# Patient Record
Sex: Female | Born: 1945 | Race: White | Hispanic: No | Marital: Married | Smoking: Never smoker
Health system: Southern US, Community
[De-identification: ages and names within clinical notes are randomized; demographics above are authoritative.]

## PROBLEM LIST (undated history)

## (undated) DIAGNOSIS — E039 Hypothyroidism, unspecified: Secondary | ICD-10-CM

## (undated) DIAGNOSIS — F1010 Alcohol abuse, uncomplicated: Secondary | ICD-10-CM

## (undated) DIAGNOSIS — M8580 Other specified disorders of bone density and structure, unspecified site: Secondary | ICD-10-CM

## (undated) DIAGNOSIS — C4491 Basal cell carcinoma of skin, unspecified: Secondary | ICD-10-CM

## (undated) DIAGNOSIS — E7800 Pure hypercholesterolemia, unspecified: Secondary | ICD-10-CM

## (undated) DIAGNOSIS — F32A Depression, unspecified: Secondary | ICD-10-CM

## (undated) HISTORY — PX: ADENOIDECTOMY: SUR15

## (undated) HISTORY — PX: COLONOSCOPY: SHX174

## (undated) HISTORY — PX: APPENDECTOMY: SHX54

## (undated) HISTORY — PX: TONSILLECTOMY: SUR1361

---

## 1998-04-18 ENCOUNTER — Other Ambulatory Visit: Admission: RE | Admit: 1998-04-18 | Discharge: 1998-04-18 | Payer: Self-pay | Admitting: *Deleted

## 1998-07-11 ENCOUNTER — Other Ambulatory Visit: Admission: RE | Admit: 1998-07-11 | Discharge: 1998-07-11 | Payer: Self-pay | Admitting: *Deleted

## 2001-05-14 ENCOUNTER — Other Ambulatory Visit: Admission: RE | Admit: 2001-05-14 | Discharge: 2001-05-14 | Payer: Self-pay | Admitting: *Deleted

## 2002-08-20 ENCOUNTER — Ambulatory Visit (HOSPITAL_COMMUNITY): Admission: RE | Admit: 2002-08-20 | Discharge: 2002-08-20 | Payer: Self-pay | Admitting: Gastroenterology

## 2002-08-20 ENCOUNTER — Encounter (INDEPENDENT_AMBULATORY_CARE_PROVIDER_SITE_OTHER): Payer: Self-pay | Admitting: Specialist

## 2002-09-01 ENCOUNTER — Encounter: Payer: Self-pay | Admitting: Gastroenterology

## 2002-09-01 ENCOUNTER — Encounter: Admission: RE | Admit: 2002-09-01 | Discharge: 2002-09-01 | Payer: Self-pay | Admitting: Gastroenterology

## 2003-06-02 ENCOUNTER — Other Ambulatory Visit: Admission: RE | Admit: 2003-06-02 | Discharge: 2003-06-02 | Payer: Self-pay | Admitting: Family Medicine

## 2003-08-05 ENCOUNTER — Encounter: Admission: RE | Admit: 2003-08-05 | Discharge: 2003-08-05 | Payer: Self-pay | Admitting: Family Medicine

## 2004-08-04 IMAGING — US US PELVIS COMPLETE MODIFY
1 series · 14 of 25 positions shown · non-contrast
Comparison: none

CLINICAL DATA: Abdominal pain and bloating.
 ULTRASOUND OF THE PELVIS
 Both transabdominal and transvaginal ultrasound of the pelvis were performed.  The uterus is retroflexed.  The uterus measures 6.2 cm sagittally with a depth of 3.0 cm and a width of 3.7 cm.  The endometrium measures 1.1 mm in thickness and there are a few echogenic areas along the endometrial lining.  The ovaries are not well seen.  The best measurements possible show the right ovary to measuring 1.9 x 1.0 x 0.9 cm and the left ovary 1.6 x 1.1 x 0.9 cm.  Only a small amount of free fluid is noted in the pelvis.
 IMPRESSION
 Negative pelvic ultrasound.  Neither ovary is well seen.

[Series 1: unknown · 0.22mm/px · 14 of 48 slices shown]
[im 1/48]
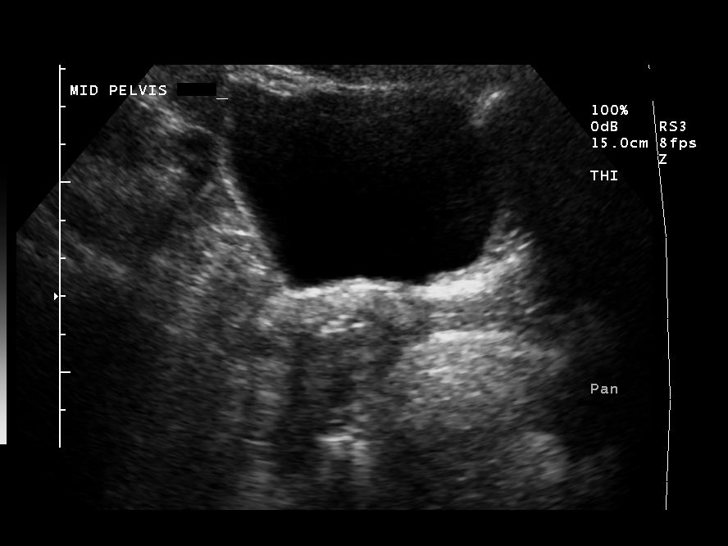
[im 4/48]
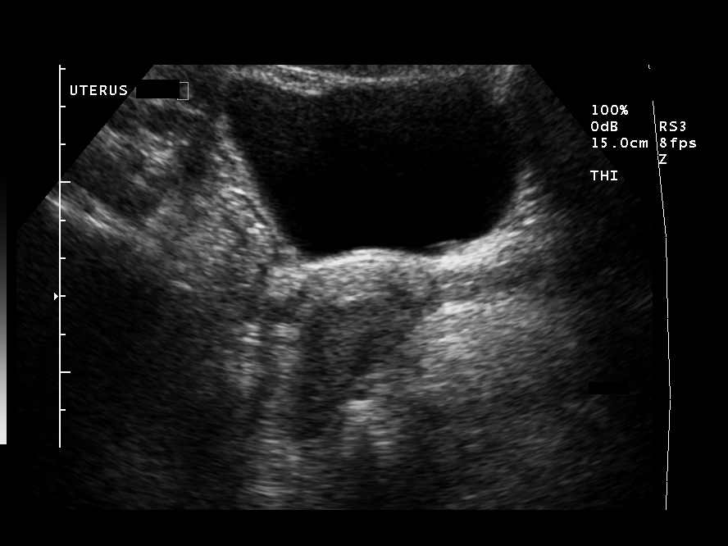
[im 8/48]
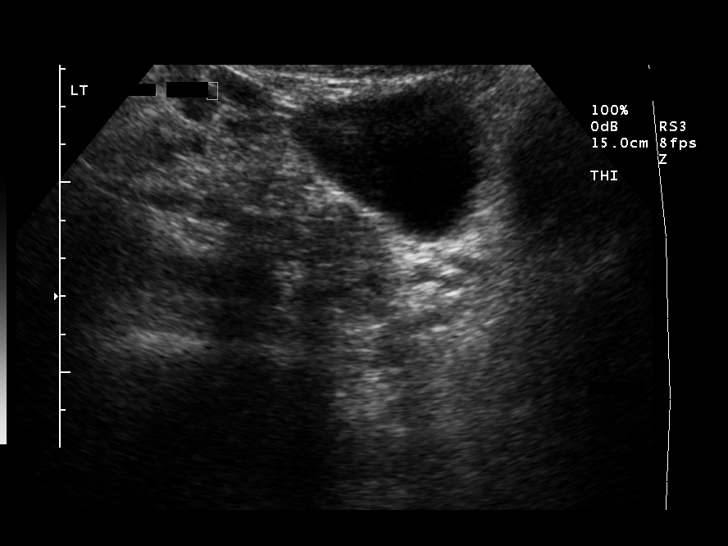
[im 12/48]
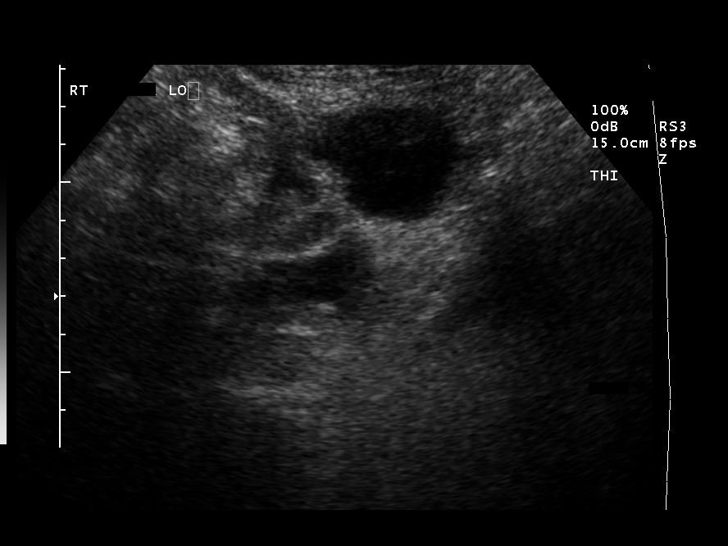
[im 16/48]
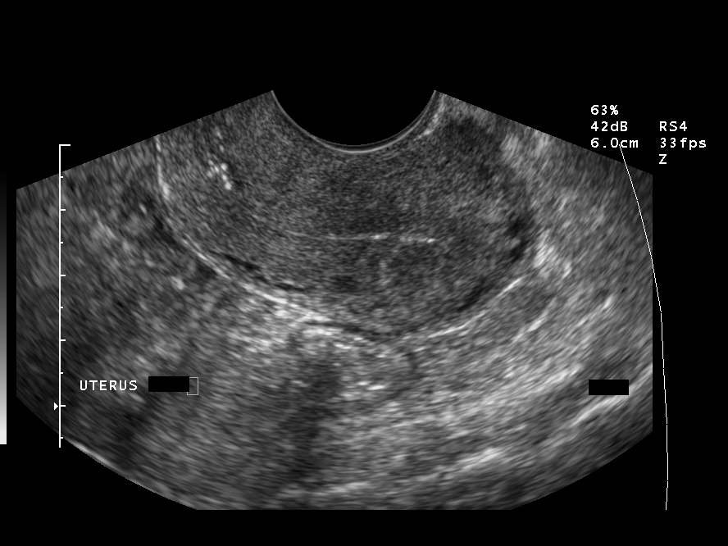
[im 18/48]
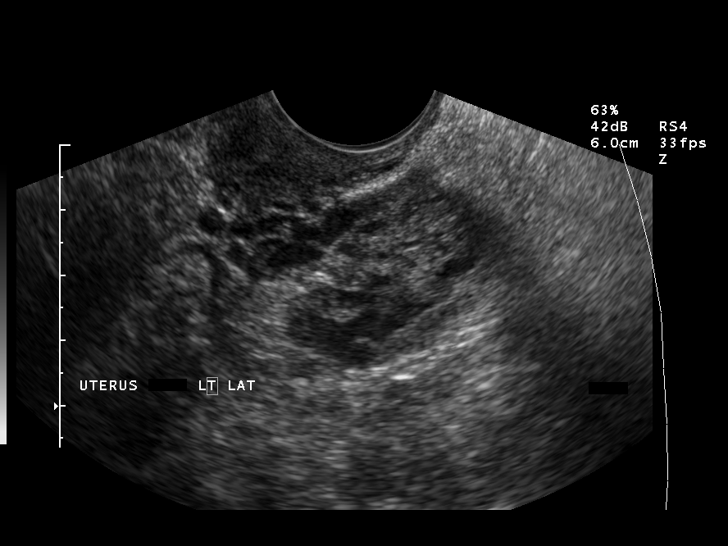
[im 22/48]
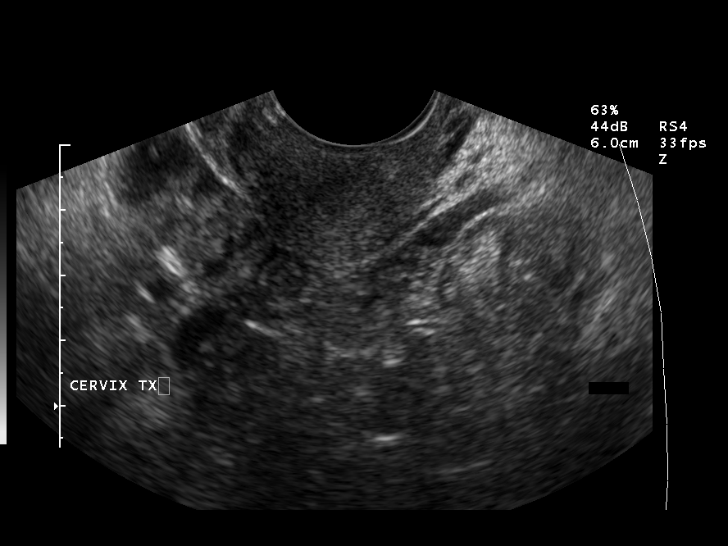
[im 26/48]
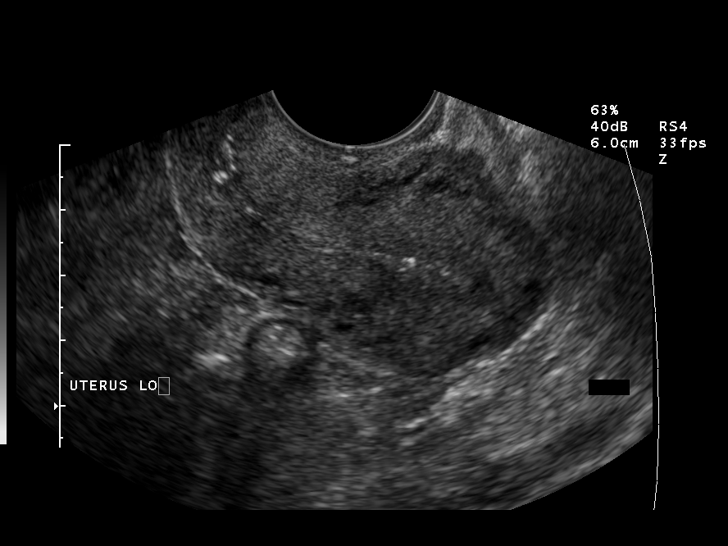
[im 30/48]
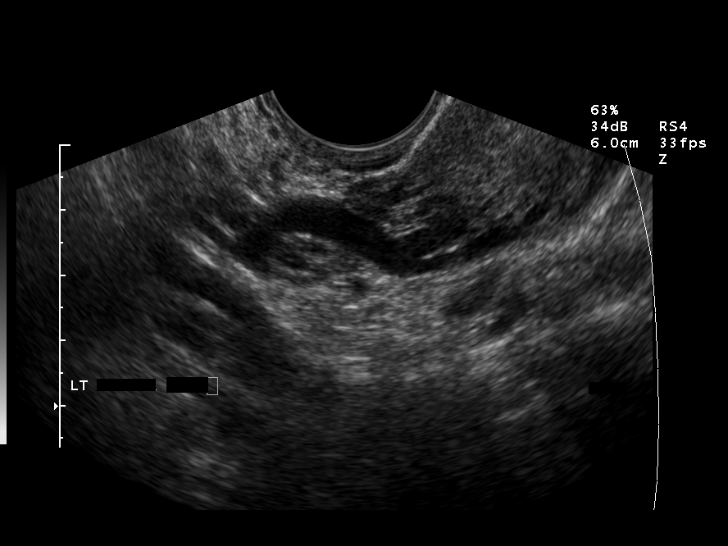
[im 32/48]
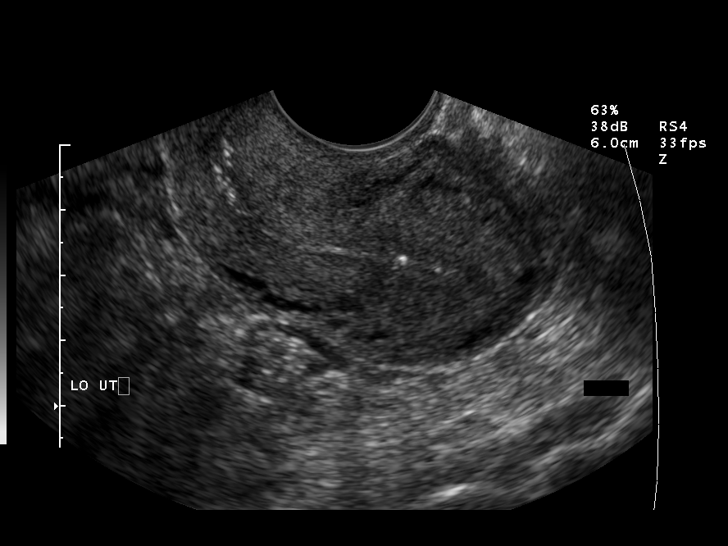
[im 36/48]
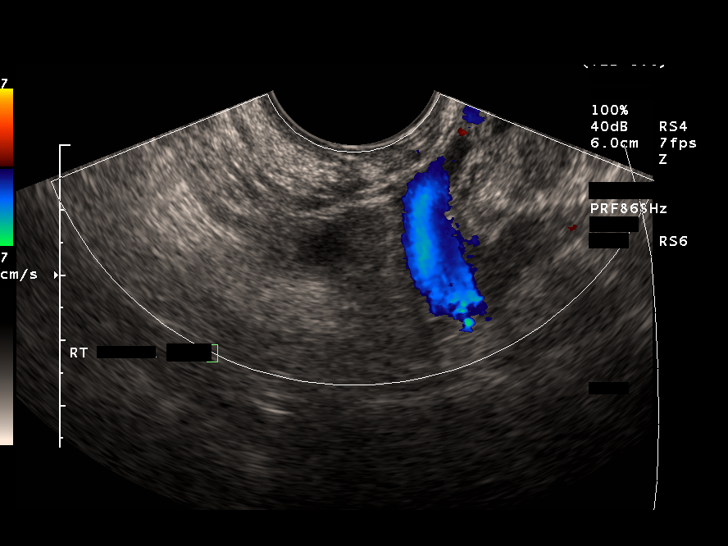
[im 40/48]
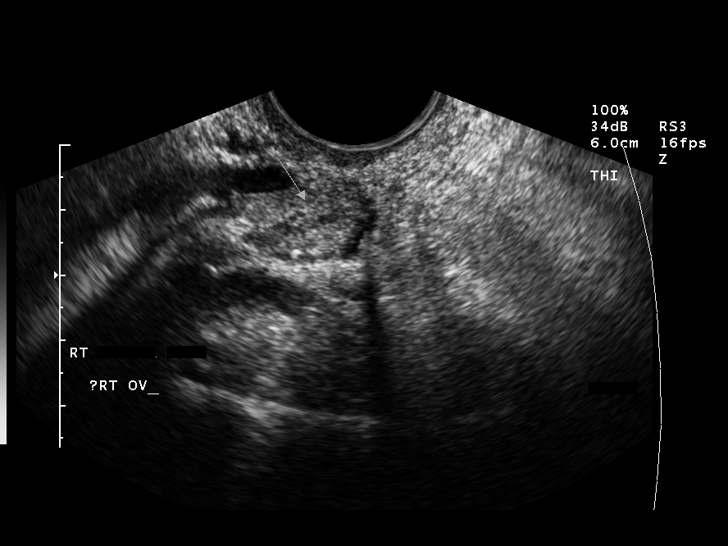
[im 44/48]
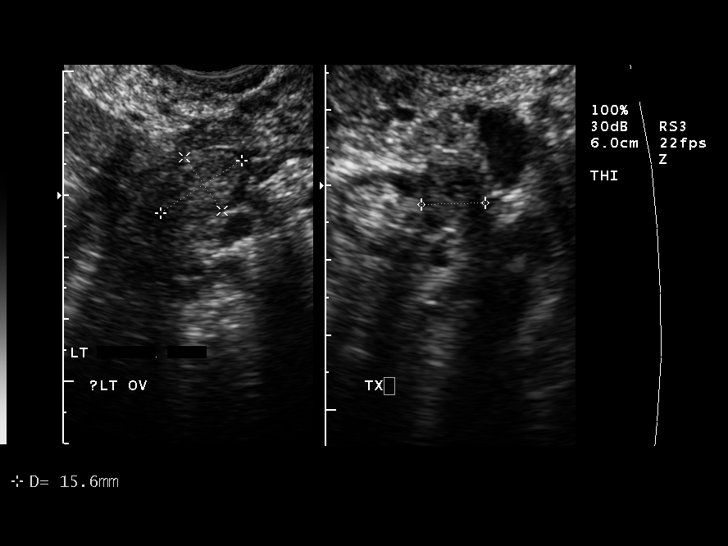
[im 48/48]
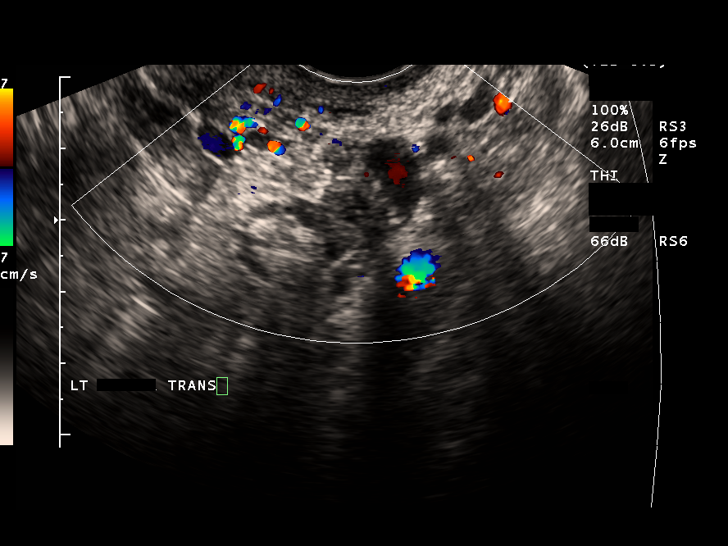

[14 of 25 positions shown; findings below may reference images not displayed]

## 2006-01-30 ENCOUNTER — Other Ambulatory Visit: Admission: RE | Admit: 2006-01-30 | Discharge: 2006-01-30 | Payer: Self-pay | Admitting: Family Medicine

## 2007-02-06 ENCOUNTER — Other Ambulatory Visit: Admission: RE | Admit: 2007-02-06 | Discharge: 2007-02-06 | Payer: Self-pay | Admitting: Family Medicine

## 2008-02-12 ENCOUNTER — Other Ambulatory Visit: Admission: RE | Admit: 2008-02-12 | Discharge: 2008-02-12 | Payer: Self-pay | Admitting: Family Medicine

## 2010-06-16 NOTE — Op Note (Signed)
NAME:  Samantha Patel, Samantha Patel                          ACCOUNT NO.:  [id number]   MEDICAL RECORD NO.:  [id number]                   PATIENT TYPE:  AMB   LOCATION:  ENDO                                 FACILITY:  [facility]   PHYSICIAN:  Petra Kuba, M.D.                 DATE OF BIRTH:  1945-04-25   DATE OF PROCEDURE:  08/20/2002  DATE OF DISCHARGE:                                 OPERATIVE REPORT   PROCEDURE:  Colonoscopy with biopsy.   INDICATION:  The patient with longstanding diarrhea, due for colonic  screening.  Consent was signed after risks, benefits, methods, options  thoroughly discussed in the office in the past.   MEDICINES USED:  1. Demerol 80.  2. Versed 8.   DESCRIPTION OF PROCEDURE:  Rectal inspection was pertinent for external  hemorrhoids, small.  Digital exam was negative.  The video pediatric  adjustable colonoscope was inserted and with some difficulty due to a  tortuous colon, was able to be advanced to the cecum.  This did require  rolling him on her on her back and some abdominal pressure.  No obvious  abnormalities were seen on insertion.  The cecum was identified by the  appendiceal orifice and the ileocecal valve.  In fact, the scope was  inserted a short ways into the terminal ileum which was normal.  Photodocumentation and scattered biopsies were obtained.  The scope was  slowly withdrawn.  Random colon biopsies were obtained of normal-appearing  mucosa and put in a second container.  On slow withdrawal through the colon,  there was no signs of diverticula, colitis, or other abnormalities as we  slowly withdrew back to the rectum.  Once back in the rectum, three tiny  hyperplastic-appearing polyps were seen and were all cold biopsied x 1 or 2  and put in a third container.  Anorectal pull-through and retroflexion in  the rectum confirmed some small hemorrhoids.  The scope was reinserted a  short ways up the left side of the colon; air was suctioned and scope  removed.  The patient tolerated the procedure well.  There was no obvious  immediate complication.   ENDOSCOPIC DIAGNOSES:  1. Internal-external hemorrhoids.  2. Tortuous colon.  3. Three tiny hyperplastic-appearing rectal polyps, cold biopsied.  4. Otherwise within normal limits to the terminal ileum, status post random     biopsies throughout.   PLAN:  1. Await pathology to determine future colonic screening as well as if     treatment for microscopic colitis is needed.     Otherwise, would treat her in the meantime like irritable bowel with     antispasmodic with possibly Carafate or Questran, possibly even fiber and     consider an upper GI small-bowel follow-through next.  2. Happy to set up a follow-up appointment or can leave the above to Christella Noa, M.D.  Petra Kuba, M.D.    MEM/MEDQ  D:  08/20/2002  T:  08/20/2002  Job:  [id number]   cc:   Christella Noa, M.D.  [address] [place]  [place], [place] [address]  Fax: [phone]

## 2011-10-29 ENCOUNTER — Ambulatory Visit (INDEPENDENT_AMBULATORY_CARE_PROVIDER_SITE_OTHER): Payer: Medicare Other | Admitting: Licensed Clinical Social Worker

## 2011-10-29 DIAGNOSIS — IMO0002 Reserved for concepts with insufficient information to code with codable children: Secondary | ICD-10-CM

## 2011-11-30 ENCOUNTER — Ambulatory Visit (INDEPENDENT_AMBULATORY_CARE_PROVIDER_SITE_OTHER): Payer: Medicare Other | Admitting: Licensed Clinical Social Worker

## 2011-11-30 DIAGNOSIS — IMO0002 Reserved for concepts with insufficient information to code with codable children: Secondary | ICD-10-CM

## 2012-01-02 ENCOUNTER — Ambulatory Visit (INDEPENDENT_AMBULATORY_CARE_PROVIDER_SITE_OTHER): Payer: Medicare Other | Admitting: Licensed Clinical Social Worker

## 2012-01-02 DIAGNOSIS — IMO0002 Reserved for concepts with insufficient information to code with codable children: Secondary | ICD-10-CM

## 2012-02-04 ENCOUNTER — Ambulatory Visit (INDEPENDENT_AMBULATORY_CARE_PROVIDER_SITE_OTHER): Payer: 59 | Admitting: Licensed Clinical Social Worker

## 2012-02-04 DIAGNOSIS — IMO0002 Reserved for concepts with insufficient information to code with codable children: Secondary | ICD-10-CM

## 2012-03-03 ENCOUNTER — Ambulatory Visit (INDEPENDENT_AMBULATORY_CARE_PROVIDER_SITE_OTHER): Payer: 59 | Admitting: Licensed Clinical Social Worker

## 2012-03-03 DIAGNOSIS — IMO0002 Reserved for concepts with insufficient information to code with codable children: Secondary | ICD-10-CM

## 2012-03-31 ENCOUNTER — Ambulatory Visit (INDEPENDENT_AMBULATORY_CARE_PROVIDER_SITE_OTHER): Payer: Medicare Other | Admitting: Licensed Clinical Social Worker

## 2012-04-28 ENCOUNTER — Ambulatory Visit (INDEPENDENT_AMBULATORY_CARE_PROVIDER_SITE_OTHER): Payer: Medicare Other | Admitting: Licensed Clinical Social Worker

## 2012-04-28 DIAGNOSIS — IMO0002 Reserved for concepts with insufficient information to code with codable children: Secondary | ICD-10-CM

## 2012-05-26 ENCOUNTER — Ambulatory Visit (INDEPENDENT_AMBULATORY_CARE_PROVIDER_SITE_OTHER): Payer: Medicare Other | Admitting: Licensed Clinical Social Worker

## 2012-05-26 DIAGNOSIS — IMO0002 Reserved for concepts with insufficient information to code with codable children: Secondary | ICD-10-CM

## 2012-06-30 ENCOUNTER — Ambulatory Visit (INDEPENDENT_AMBULATORY_CARE_PROVIDER_SITE_OTHER): Payer: Medicare Other | Admitting: Licensed Clinical Social Worker

## 2012-06-30 DIAGNOSIS — IMO0002 Reserved for concepts with insufficient information to code with codable children: Secondary | ICD-10-CM

## 2012-08-04 ENCOUNTER — Ambulatory Visit (INDEPENDENT_AMBULATORY_CARE_PROVIDER_SITE_OTHER): Payer: Medicare Other | Admitting: Licensed Clinical Social Worker

## 2012-08-04 DIAGNOSIS — IMO0002 Reserved for concepts with insufficient information to code with codable children: Secondary | ICD-10-CM

## 2012-09-08 ENCOUNTER — Ambulatory Visit (INDEPENDENT_AMBULATORY_CARE_PROVIDER_SITE_OTHER): Payer: 59 | Admitting: Licensed Clinical Social Worker

## 2012-09-08 DIAGNOSIS — IMO0002 Reserved for concepts with insufficient information to code with codable children: Secondary | ICD-10-CM

## 2012-10-06 ENCOUNTER — Ambulatory Visit (INDEPENDENT_AMBULATORY_CARE_PROVIDER_SITE_OTHER): Payer: 59 | Admitting: Licensed Clinical Social Worker

## 2012-10-06 DIAGNOSIS — IMO0002 Reserved for concepts with insufficient information to code with codable children: Secondary | ICD-10-CM

## 2012-11-03 ENCOUNTER — Ambulatory Visit (INDEPENDENT_AMBULATORY_CARE_PROVIDER_SITE_OTHER): Payer: 59 | Admitting: Licensed Clinical Social Worker

## 2012-11-03 DIAGNOSIS — IMO0002 Reserved for concepts with insufficient information to code with codable children: Secondary | ICD-10-CM

## 2012-12-01 ENCOUNTER — Ambulatory Visit (INDEPENDENT_AMBULATORY_CARE_PROVIDER_SITE_OTHER): Payer: 59 | Admitting: Licensed Clinical Social Worker

## 2012-12-01 DIAGNOSIS — IMO0002 Reserved for concepts with insufficient information to code with codable children: Secondary | ICD-10-CM

## 2012-12-29 ENCOUNTER — Ambulatory Visit (INDEPENDENT_AMBULATORY_CARE_PROVIDER_SITE_OTHER): Payer: 59 | Admitting: Licensed Clinical Social Worker

## 2012-12-29 DIAGNOSIS — IMO0002 Reserved for concepts with insufficient information to code with codable children: Secondary | ICD-10-CM

## 2013-02-02 ENCOUNTER — Ambulatory Visit (INDEPENDENT_AMBULATORY_CARE_PROVIDER_SITE_OTHER): Payer: 59 | Admitting: Licensed Clinical Social Worker

## 2013-02-02 DIAGNOSIS — IMO0002 Reserved for concepts with insufficient information to code with codable children: Secondary | ICD-10-CM

## 2013-03-09 ENCOUNTER — Ambulatory Visit (INDEPENDENT_AMBULATORY_CARE_PROVIDER_SITE_OTHER): Payer: 59 | Admitting: Licensed Clinical Social Worker

## 2013-03-09 DIAGNOSIS — IMO0002 Reserved for concepts with insufficient information to code with codable children: Secondary | ICD-10-CM

## 2013-04-20 ENCOUNTER — Ambulatory Visit (INDEPENDENT_AMBULATORY_CARE_PROVIDER_SITE_OTHER): Payer: 59 | Admitting: Licensed Clinical Social Worker

## 2013-04-20 DIAGNOSIS — IMO0002 Reserved for concepts with insufficient information to code with codable children: Secondary | ICD-10-CM

## 2013-06-01 ENCOUNTER — Ambulatory Visit (INDEPENDENT_AMBULATORY_CARE_PROVIDER_SITE_OTHER): Payer: 59 | Admitting: Licensed Clinical Social Worker

## 2013-06-01 DIAGNOSIS — IMO0002 Reserved for concepts with insufficient information to code with codable children: Secondary | ICD-10-CM

## 2013-07-13 ENCOUNTER — Ambulatory Visit (INDEPENDENT_AMBULATORY_CARE_PROVIDER_SITE_OTHER): Payer: 59 | Admitting: Licensed Clinical Social Worker

## 2013-07-13 DIAGNOSIS — IMO0002 Reserved for concepts with insufficient information to code with codable children: Secondary | ICD-10-CM

## 2013-08-17 ENCOUNTER — Ambulatory Visit (INDEPENDENT_AMBULATORY_CARE_PROVIDER_SITE_OTHER): Payer: 59 | Admitting: Licensed Clinical Social Worker

## 2013-08-17 DIAGNOSIS — IMO0002 Reserved for concepts with insufficient information to code with codable children: Secondary | ICD-10-CM

## 2013-09-14 ENCOUNTER — Ambulatory Visit (INDEPENDENT_AMBULATORY_CARE_PROVIDER_SITE_OTHER): Payer: 59 | Admitting: Licensed Clinical Social Worker

## 2013-09-14 DIAGNOSIS — IMO0002 Reserved for concepts with insufficient information to code with codable children: Secondary | ICD-10-CM

## 2013-10-19 ENCOUNTER — Ambulatory Visit (INDEPENDENT_AMBULATORY_CARE_PROVIDER_SITE_OTHER): Payer: 59 | Admitting: Licensed Clinical Social Worker

## 2013-10-19 DIAGNOSIS — IMO0002 Reserved for concepts with insufficient information to code with codable children: Secondary | ICD-10-CM

## 2013-11-30 ENCOUNTER — Ambulatory Visit (INDEPENDENT_AMBULATORY_CARE_PROVIDER_SITE_OTHER): Payer: 59 | Admitting: Licensed Clinical Social Worker

## 2013-11-30 DIAGNOSIS — F3341 Major depressive disorder, recurrent, in partial remission: Secondary | ICD-10-CM

## 2014-01-11 ENCOUNTER — Ambulatory Visit (INDEPENDENT_AMBULATORY_CARE_PROVIDER_SITE_OTHER): Payer: 59 | Admitting: Licensed Clinical Social Worker

## 2014-01-11 DIAGNOSIS — F3341 Major depressive disorder, recurrent, in partial remission: Secondary | ICD-10-CM

## 2014-03-08 ENCOUNTER — Ambulatory Visit (INDEPENDENT_AMBULATORY_CARE_PROVIDER_SITE_OTHER): Payer: 59 | Admitting: Licensed Clinical Social Worker

## 2014-03-08 DIAGNOSIS — F3341 Major depressive disorder, recurrent, in partial remission: Secondary | ICD-10-CM

## 2014-05-31 ENCOUNTER — Ambulatory Visit (INDEPENDENT_AMBULATORY_CARE_PROVIDER_SITE_OTHER): Payer: 59 | Admitting: Licensed Clinical Social Worker

## 2014-05-31 DIAGNOSIS — F3341 Major depressive disorder, recurrent, in partial remission: Secondary | ICD-10-CM | POA: Diagnosis not present

## 2014-08-30 ENCOUNTER — Ambulatory Visit (INDEPENDENT_AMBULATORY_CARE_PROVIDER_SITE_OTHER): Payer: 59 | Admitting: Licensed Clinical Social Worker

## 2014-08-30 DIAGNOSIS — F3341 Major depressive disorder, recurrent, in partial remission: Secondary | ICD-10-CM

## 2014-11-29 ENCOUNTER — Ambulatory Visit (INDEPENDENT_AMBULATORY_CARE_PROVIDER_SITE_OTHER): Payer: 59 | Admitting: Licensed Clinical Social Worker

## 2014-11-29 DIAGNOSIS — F3341 Major depressive disorder, recurrent, in partial remission: Secondary | ICD-10-CM

## 2015-05-16 ENCOUNTER — Ambulatory Visit (INDEPENDENT_AMBULATORY_CARE_PROVIDER_SITE_OTHER): Payer: 59 | Admitting: Licensed Clinical Social Worker

## 2015-05-16 DIAGNOSIS — F3341 Major depressive disorder, recurrent, in partial remission: Secondary | ICD-10-CM | POA: Diagnosis not present

## 2015-11-21 ENCOUNTER — Ambulatory Visit (INDEPENDENT_AMBULATORY_CARE_PROVIDER_SITE_OTHER): Payer: 59 | Admitting: Licensed Clinical Social Worker

## 2015-11-21 DIAGNOSIS — F3341 Major depressive disorder, recurrent, in partial remission: Secondary | ICD-10-CM

## 2016-01-02 ENCOUNTER — Ambulatory Visit (INDEPENDENT_AMBULATORY_CARE_PROVIDER_SITE_OTHER): Payer: Medicare Other | Admitting: Podiatry

## 2016-01-02 ENCOUNTER — Encounter: Payer: Self-pay | Admitting: Podiatry

## 2016-01-02 ENCOUNTER — Ambulatory Visit (INDEPENDENT_AMBULATORY_CARE_PROVIDER_SITE_OTHER): Payer: Medicare Other

## 2016-01-02 VITALS — BP 133/60 | HR 73 | Resp 16

## 2016-01-02 DIAGNOSIS — M79672 Pain in left foot: Secondary | ICD-10-CM

## 2016-01-02 DIAGNOSIS — M722 Plantar fascial fibromatosis: Secondary | ICD-10-CM | POA: Diagnosis not present

## 2016-01-02 MED ORDER — TRIAMCINOLONE ACETONIDE 10 MG/ML IJ SUSP
10.0000 mg | Freq: Once | INTRAMUSCULAR | Status: AC
  Administered 2016-01-02: 10 mg

## 2016-01-02 NOTE — Progress Notes (Signed)
   Subjective:    Patient ID: Samantha Patel, female    DOB: Jul 18, 1945, 70 y.o.   MRN: [id number]  HPI Chief Complaint  Patient presents with  . Foot Pain    Left foot; heel-lateral side; x6-8 months      Review of Systems  Musculoskeletal: Positive for gait problem.  All other systems reviewed and are negative.      Objective:   Physical Exam        Assessment & Plan:

## 2016-01-02 NOTE — Patient Instructions (Signed)

## 2016-01-04 NOTE — Progress Notes (Signed)
Subjective:     Patient ID: Samantha Patel, female   DOB: 1945-09-24, 70 y.o.   MRN: [id number]  HPI patient states she started develop a lot of pain in the plantar aspect of her left heel and it's been getting gradually worse over the last few months   Review of Systems  All other systems reviewed and are negative.      Objective:   Physical Exam  Constitutional: She is oriented to person, place, and time.  Cardiovascular: Intact distal pulses.   Musculoskeletal: Normal range of motion.  Neurological: She is oriented to person, place, and time.  Skin: Skin is warm.  Nursing note and vitals reviewed.  neurovascular status intact muscle strength adequate with patient found to have exquisite inflammation plantar aspect left heel on the lateral side with fluid buildup noted around the lateral and central band. Patient's found have good digital perfusion well oriented 3     Assessment:     Inflammatory fasciitis plantar left lateral side    Plan:     H&P conditions reviewed and careful lateral injection administered 3 mg Kenalog 5 mg Xylocaine and advised on anti-inflammatories physical therapy. Reappoint for [place] to recheck

## 2016-01-09 ENCOUNTER — Ambulatory Visit (INDEPENDENT_AMBULATORY_CARE_PROVIDER_SITE_OTHER): Payer: Medicare Other | Admitting: Podiatry

## 2016-01-09 ENCOUNTER — Encounter: Payer: Self-pay | Admitting: Podiatry

## 2016-01-09 DIAGNOSIS — M79672 Pain in left foot: Secondary | ICD-10-CM

## 2016-01-09 DIAGNOSIS — M722 Plantar fascial fibromatosis: Secondary | ICD-10-CM | POA: Diagnosis not present

## 2016-01-10 NOTE — Progress Notes (Signed)
Subjective:     Patient ID: Samantha Patel, female   DOB: [birth year], 70 y.o.   MRN: [id number]  HPI patient states she's improved but is still having some pain with ambulation   Review of Systems     Objective:   Physical Exam Neurovascular status intact muscle strength adequate with pain still noted in the fascia but improved from previous visit with mild pain upon deep palpation    Assessment:     Plantar fasciitis improved but still painful    Plan:     Advised on physical therapy anti-inflammatories supportive shoes and reappoint as needed for treatment

## 2016-03-16 ENCOUNTER — Other Ambulatory Visit: Payer: Self-pay | Admitting: Family Medicine

## 2016-03-16 DIAGNOSIS — R6889 Other general symptoms and signs: Secondary | ICD-10-CM

## 2016-03-22 ENCOUNTER — Ambulatory Visit
Admission: RE | Admit: 2016-03-22 | Discharge: 2016-03-22 | Disposition: A | Discharge status: Home / Self Care | Payer: Medicare (Managed Care) | Source: Ambulatory Visit | Attending: Family Medicine | Admitting: Family Medicine

## 2016-03-22 DIAGNOSIS — R6889 Other general symptoms and signs: Secondary | ICD-10-CM

## 2019-02-27 ENCOUNTER — Ambulatory Visit: Payer: Medicare (Managed Care)

## 2019-03-07 ENCOUNTER — Ambulatory Visit: Payer: Self-pay | Attending: Internal Medicine

## 2019-03-07 DIAGNOSIS — Z23 Encounter for immunization: Secondary | ICD-10-CM | POA: Insufficient documentation

## 2019-03-07 NOTE — Progress Notes (Signed)
   Covid-19 Vaccination Clinic  Name:  AASHRITHA BRISSETT    MRN: [id number] DOB: 04-26-1945  03/07/2019  Ms. Bunn was observed post Covid-19 immunization for 15 minutes without incidence. She was provided with Vaccine Information Sheet and instruction to access the V-Safe system.   Ms. Macmahon was instructed to call 911 with any severe reactions post vaccine: Marland Kitchen Difficulty breathing  . Swelling of your face and throat  . A fast heartbeat  . A bad rash all over your body  . Dizziness and weakness    Immunizations Administered    Name Date Dose VIS Date Route   Pfizer COVID-19 Vaccine 03/07/2019  2:24 PM 0.3 mL 01/09/2019 Intramuscular   Manufacturer: [place]   Lot: [id number]   [place]: [id number]

## 2019-03-20 ENCOUNTER — Ambulatory Visit: Payer: Medicare (Managed Care)

## 2019-04-01 ENCOUNTER — Ambulatory Visit: Payer: Medicare PPO | Attending: Internal Medicine

## 2019-04-01 DIAGNOSIS — Z23 Encounter for immunization: Secondary | ICD-10-CM

## 2019-04-01 NOTE — Progress Notes (Signed)
   Covid-19 Vaccination Clinic  Name:  Samantha Patel    MRN: [id number] DOB: 09-Apr-1945  04/01/2019  [name] was observed post Covid-19 immunization for 15 minutes without incident. She was provided with Vaccine Information Sheet and instruction to access the V-Safe system.   [name] was instructed to call 911 with any severe reactions post vaccine: Marland Kitchen Difficulty breathing  . Swelling of face and throat  . A fast heartbeat  . A bad rash all over body  . Dizziness and weakness   Immunizations Administered    Name Date Dose VIS Date Route   Pfizer COVID-19 Vaccine 04/01/2019  9:46 AM 0.3 mL 01/09/2019 Intramuscular   Manufacturer: [place]   Lot: [id number]   [place]: [id number]

## 2019-11-14 ENCOUNTER — Ambulatory Visit: Payer: Medicare PPO | Attending: Internal Medicine

## 2019-11-14 DIAGNOSIS — Z23 Encounter for immunization: Secondary | ICD-10-CM

## 2019-11-14 NOTE — Progress Notes (Signed)
   Covid-19 Vaccination Clinic  Name:  Samantha Patel    MRN: [id number] DOB: August 18, 1945  11/14/2019  Samantha Patel was observed post Covid-19 immunization for 15 minutes without incident. She was provided with Vaccine Information Sheet and instruction to access the V-Safe system.   Samantha Patel was instructed to call 911 with any severe reactions post vaccine: Marland Kitchen Difficulty breathing  . Swelling of face and throat  . A fast heartbeat  . A bad rash all over body  . Dizziness and weakness

## 2019-11-18 ENCOUNTER — Other Ambulatory Visit: Payer: Self-pay | Admitting: Family Medicine

## 2019-11-18 DIAGNOSIS — M8580 Other specified disorders of bone density and structure, unspecified site: Secondary | ICD-10-CM

## 2020-11-16 ENCOUNTER — Other Ambulatory Visit: Payer: Self-pay | Admitting: Family Medicine

## 2020-11-16 ENCOUNTER — Ambulatory Visit
Admission: RE | Admit: 2020-11-16 | Discharge: 2020-11-16 | Disposition: A | Discharge status: Home / Self Care | Payer: Medicare PPO | Source: Ambulatory Visit | Attending: Family Medicine | Admitting: Family Medicine

## 2020-11-16 DIAGNOSIS — R053 Chronic cough: Secondary | ICD-10-CM

## 2020-12-15 ENCOUNTER — Other Ambulatory Visit (HOSPITAL_COMMUNITY): Payer: Self-pay

## 2021-05-01 ENCOUNTER — Other Ambulatory Visit: Payer: Self-pay

## 2021-05-01 ENCOUNTER — Encounter (HOSPITAL_BASED_OUTPATIENT_CLINIC_OR_DEPARTMENT_OTHER): Payer: Self-pay | Admitting: Orthopaedic Surgery

## 2021-05-09 ENCOUNTER — Encounter (HOSPITAL_BASED_OUTPATIENT_CLINIC_OR_DEPARTMENT_OTHER)
Admission: RE | Admit: 2021-05-09 | Discharge: 2021-05-09 | Disposition: A | Discharge status: Home / Self Care | Payer: Medicare PPO | Source: Ambulatory Visit | Attending: Orthopaedic Surgery | Admitting: Orthopaedic Surgery

## 2021-05-09 DIAGNOSIS — M19011 Primary osteoarthritis, right shoulder: Secondary | ICD-10-CM | POA: Diagnosis present

## 2021-05-09 DIAGNOSIS — E039 Hypothyroidism, unspecified: Secondary | ICD-10-CM | POA: Diagnosis not present

## 2021-05-09 DIAGNOSIS — W19XXXA Unspecified fall, initial encounter: Secondary | ICD-10-CM | POA: Diagnosis not present

## 2021-05-09 LAB — SURGICAL PCR SCREEN
MRSA, PCR: NEGATIVE
Staphylococcus aureus: NEGATIVE

## 2021-05-09 NOTE — H&P (Signed)
? ? ?PREOPERATIVE H&P ? ?Chief Complaint: RIGHT SHOULDER DJD ? ?HPI: ?Samantha Patel is a 76 y.o. female who is scheduled for Procedure(s): ?REVERSE SHOULDER ARTHROPLASTY.  ? ?Patient has a past medical history significant for hypothyroidism.  ? ?This is a 76 year old who had a suspected rotator cuff years ago.  About four months ago she fell again and had worsening symptoms.  She had an injection, but had no relief.  She tried nonoperative measures, which failed.   ? ?Symptoms are rated as moderate to severe, and have been worsening.  This is significantly impairing activities of daily living.   ? ?Please see clinic note for further details on this patient's care.   ? ?She has elected for surgical management.  ? ?Past Medical History:  ?Diagnosis Date  ? Alcohol abuse   ? Basal cell carcinoma   ? Depression   ? Hypercholesteremia   ? Hypothyroidism   ? Osteopenia   ? ?Past Surgical History:  ?Procedure Laterality Date  ? ADENOIDECTOMY    ? APPENDECTOMY    ? COLONOSCOPY    ? TONSILLECTOMY    ? ?Social History  ? ?Socioeconomic History  ? Marital status: Married  ?  Spouse name: Not on file  ? Number of children: Not on file  ? Years of education: Not on file  ? Highest education level: Not on file  ?Occupational History  ? Not on file  ?Tobacco Use  ? Smoking status: Never  ? Smokeless tobacco: Never  ?Substance and Sexual Activity  ? Alcohol use: Yes  ?  Comment: socially  ? Drug use: Not Currently  ? Sexual activity: Not Currently  ?Other Topics Concern  ? Not on file  ?Social History Narrative  ? Not on file  ? ?Social Determinants of Health  ? ?Financial Resource Strain: Not on file  ?Food Insecurity: Not on file  ?Transportation Needs: Not on file  ?Physical Activity: Not on file  ?Stress: Not on file  ?Social Connections: Not on file  ? ?History reviewed. No pertinent family history. ?Allergies  ?Allergen Reactions  ? Other   ?  Band-aids - adhesive makes them itch  ? ?Prior to Admission medications    ?Medication Sig Start Date End Date Taking? Authorizing Provider  ?atorvastatin (LIPITOR) 40 MG tablet  12/21/15  Yes [provider]  ?b complex vitamins tablet Take 1 tablet by mouth daily.   Yes [provider]  ?buPROPion (WELLBUTRIN XL) 300 MG 24 hr tablet  09/26/15  Yes [provider]  ?Calcium Carb-Cholecalciferol (CALCIUM/VITAMIN D PO) Take by mouth.   Yes [provider]  ?Carbonyl Iron (FEOSOL PO) Take by mouth.   Yes [provider]  ?Cholecalciferol (VITAMIN D PO) Take by mouth.   Yes [provider]  ?citalopram (CELEXA) 20 MG tablet  12/21/15  Yes [provider]  ?Levothyroxine Sodium (SYNTHROID PO) Take by mouth.   Yes [provider]  ?OMEPRAZOLE PO Take by mouth.   Yes [provider]  ? ? ?ROS: All other systems have been reviewed and were otherwise negative with the exception of those mentioned in the HPI and as above. ? ?Physical Exam: ?General: Alert, no acute distress ?Cardiovascular: No pedal edema ?Respiratory: No cyanosis, no use of accessory musculature ?GI: No organomegaly, abdomen is soft and non-tender ?Skin: No lesions in the area of chief complaint ?Neurologic: Sensation intact distally ?Psychiatric: Patient is competent for consent with normal mood and affect ?Lymphatic: No axillary or cervical lymphadenopathy ? ?  MUSCULOSKELETAL:  ?Right shoulder: Range of motion of the shoulder to about 90.  Passive to 170.  External rotation to 45.  Internal rotation to T10.  Cuff strength is weak throughout.   ? 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   ? ?BMI: ?Body mass index is 25.51 kg/m?. ? ?No results found for: ALBUMIN ? ? ?Diabetes: ?Patient does not have a diagnosis of diabetes. ?  ? ?.  ?  ?Smoking Status: ?  ? ? ? ?Assessment: ?RIGHT SHOULDER DJD ? ?Plan: ?Plan for Procedure(s): ?REVERSE SHOULDER ARTHROPLASTY ? ?The risks benefits and alternatives were  discussed with the patient including but not limited to the risks of nonoperative treatment, versus surgical intervention including infection, bleeding, nerve injury,  blood clots, cardiopulmonary complications, morbidity, mortality, among others, and they were willing to proceed.  ? ?We additionally specifically discussed risks of axillary nerve injury, infection, periprosthetic fracture, continued pain and longevity of implants prior to beginning procedure.  ?  ?Patient will be closely monitored in PACU for medical stabilization and pain control. If found stable in PACU, patient may be discharged home with outpatient follow-up. If any concerns regarding patient's stabilization patient will be admitted for observation after surgery. The patient is planning to be discharged home with outpatient PT.  ? ?The patient acknowledged the explanation, agreed to proceed with the plan and consent was signed.  ? ?Patient was cleared for surgery by her PCP, Dr. Laverna Peace ? ?Operative Plan: Right reverse total shoulder arthroplasty ?Discharge Medications: Standard ?DVT Prophylaxis: Aspirin ?Physical Therapy: Outpatient PT ?Special Discharge needs: Sling. IceMan ? ? ?Samantha Chick, PA-C ? ?05/09/2021 ?7:27 PM ? ?

## 2021-05-09 NOTE — Discharge Instructions (Addendum)
Samantha Charter MD, MPH ?Noemi Chapel, PA-C ?[facility] ?[address], [address] ?[phone] (tel)   ?[phone] (fax) ? ? ?POST-OPERATIVE INSTRUCTIONS - TOTAL SHOULDER REPLACEMENT  ? ? ?WOUND CARE ?You may leave the operative dressing in place until your follow-up appointment. ?KEEP THE INCISIONS CLEAN AND DRY. ?There may be a small amount of fluid/bleeding leaking at the surgical site. This is normal after surgery.  ?If it fills with liquid or blood please call us immediately to change it for you. ?Use the provided ice machine or Ice packs as often as possible for the first 3-4 days, then as needed for pain relief.   ?Keep a layer of cloth or a shirt between your skin and the cooling unit to prevent frost bite as it can get very cold. ? ?SHOWERING: ?- You may shower on Post-Op Day #2.  ?- The dressing is water resistant but do not scrub it as it may start to peel up.   ?- You may remove the sling for showering, but keep a water resistant pillow under the arm to keep both the  elbow and shoulder away from the body (mimicking the abduction sling).  ?- Gently pat the area dry.  ?- Do not soak the shoulder in water. Do not go swimming in the pool or ocean until your incision has completely healed (about 4-6 weeks after surgery) ?- KEEP THE INCISIONS CLEAN AND DRY. ? ?EXERCISES ?Wear the sling at all times ?You may remove the sling for showering, but keep the arm across the chest or in a secondary sling.    ?Accidental/Purposeful External Rotation and shoulder flexion (reaching behind you) is to be avoided at all costs for the first month. ?It is ok to come out of your sling if your are sitting and have assistance for eating.   ?Do not lift anything heavier than 1 pound until we discuss it further in clinic. ? ?REGIONAL ANESTHESIA (NERVE BLOCKS) ?The anesthesia team may have performed a nerve block for you if safe in the setting of your care.  This is a great tool used to minimize pain.   Typically the block may start wearing off overnight but the long acting medicine may last for 3-4 days.  The nerve block wearing off can be a challenging period but please utilize your as needed pain medications to try and manage this period.   ? ?POST-OP MEDICATIONS- Multimodal approach to pain control ?In general your pain will be controlled with a combination of substances.  Prescriptions unless otherwise discussed are electronically sent to your pharmacy.  This is a carefully made plan we use to minimize narcotic use.    ? ?Meloxicam - Anti-inflammatory medication taken on a scheduled basis ?Acetaminophen - Non-narcotic pain medicine taken on a scheduled basis  ?Oxycodone - This is a strong narcotic, to be used only on an ?as needed? basis for SEVERE pain. ?Aspirin '81mg'$  - This medicine is used to minimize the risk of blood clots after surgery. ?Omeprazole - daily medicine to protect your stomach while taking anti-inflammatories ?Zofran -  take as needed for nausea ? ?FOLLOW-UP ?If you develop a Fever (>101.5), Redness or Drainage from the surgical incision site, please call our office to arrange for an evaluation. ?Please call the office to schedule a follow-up appointment for a wound check, 7-10 days post-operatively. ? ?IF YOU HAVE ANY QUESTIONS, PLEASE FEEL FREE TO CALL OUR OFFICE. ? ?HELPFUL INFORMATION ? ?If you had a block, it will wear off  between 8-24 hrs postop typically.  This is period when your pain may go from nearly zero to the pain you would have had post-op without the block.  This is an abrupt transition but nothing dangerous is happening.  You may take an extra dose of narcotic when this happens. ? ?Your arm will be in a sling following surgery. You will be in this sling for the next 4 weeks.  ? ?You may be more comfortable sleeping in a semi-seated position the first few nights following surgery.  Keep a pillow propped under the elbow and forearm for comfort.  If you have a recliner type of  chair it might be beneficial.  If not that is fine too, but it would be helpful to sleep propped up with pillows behind your operated shoulder as well under your elbow and forearm.  This will reduce pulling on the suture lines. ? ?When dressing, put your operative arm in the sleeve first.  When getting undressed, take your operative arm out last.  Loose fitting, button-down shirts are recommended. ? ?In most states it is against the law to drive while your arm is in a sling. And certainly against the law to drive while taking narcotics. ? ?You may return to work/school in the next couple of days when you feel up to it. Desk work and typing in the sling is fine. ? ?We suggest you use the pain medication the first night prior to going to bed, in order to ease any pain when the anesthesia wears off. You should avoid taking pain medications on an empty stomach as it will make you nauseous. ? ?Do not drink alcoholic beverages or take illicit drugs when taking pain medications. ? ?Pain medication may make you constipated.  Below are a few solutions to try in this order: ?Decrease the amount of pain medication if you aren?t having pain. ?Drink lots of decaffeinated fluids. ?Drink prune juice and/or each dried prunes ? ?If the first 3 don?t work start with additional solutions ?Take Colace - an over-the-counter stool softener ?Take Senokot - an over-the-counter laxative ?Take Miralax - a stronger over-the-counter laxative ? ? ?Dental Antibiotics: ? ?In most cases prophylactic antibiotics for Dental procdeures after total joint surgery are not necessary. ? ?Exceptions are as follows: ? ?1. History of prior total joint infection ? ?2. Severely immunocompromised (Organ Transplant, cancer chemotherapy, Rheumatoid biologic ?meds such as [place]) ? ?3. Poorly controlled diabetes (A1C &gt; 8.0, blood glucose over 200) ? ?If you have one of these conditions, contact your surgeon for an antibiotic prescription, prior to your ?dental  procedure. ? ? ?For more information including helpful videos and documents visit our website:  ? ?[email or web address] ? ? ? ? ? ?Post Anesthesia Home Care Instructions ? ?Activity: ?Get plenty of rest for the remainder of the day. A responsible individual must stay with you for 24 hours following the procedure.  ?For the next 24 hours, DO NOT: ?-Drive a car ?-Paediatric nurse ?-Drink alcoholic beverages ?-Take any medication unless instructed by your physician ?-Make any legal decisions or sign important papers. ? ? ? ?Meals: ?Start with liquid foods such as gelatin or soup. Progress to regular foods as tolerated. Avoid greasy, spicy, heavy foods. If nausea and/or vomiting occur, drink only clear liquids until the nausea and/or vomiting subsides. Call your physician if vomiting continues. ? ? ? ?Special Instructions/Symptoms: ?Your throat may feel dry or sore from the anesthesia or the breathing tube placed in your throat during surgery. If  this causes discomfort, gargle with warm salt water. The discomfort should disappear within 24 hours. ? ? ? ? ?   Regional Anesthesia Blocks ? ?1. Numbness or the inability to move the "blocked" extremity may last from 3-48 hours after placement. The length of time depends on the medication injected and your individual response to the medication. If the numbness is not going away after 48 hours, call your surgeon. ? ?2. The extremity that is blocked will need to be protected until the numbness is gone and the  Strength has returned. Because you cannot feel it, you will need to take extra care to avoid injury. Because it may be weak, you may have difficulty moving it or using it. You may not know what position it is in without looking at it while the block is in effect. ? ?3. For blocks in the legs and feet, returning to weight bearing and walking needs to be done carefully. You will need to wait until the numbness is entirely gone and the  strength has returned. You should be able to move your leg and foot normally before you try and bear weight or walk. You will need someone to be with you when you first try to ensure you do not fall and possibly r

## 2021-05-11 ENCOUNTER — Encounter (HOSPITAL_BASED_OUTPATIENT_CLINIC_OR_DEPARTMENT_OTHER)
Admission: RE | Disposition: A | Discharge status: Home / Self Care | Payer: Self-pay | Source: Home / Self Care | Attending: Orthopaedic Surgery

## 2021-05-11 ENCOUNTER — Ambulatory Visit (HOSPITAL_COMMUNITY): Payer: Medicare PPO

## 2021-05-11 ENCOUNTER — Ambulatory Visit (HOSPITAL_BASED_OUTPATIENT_CLINIC_OR_DEPARTMENT_OTHER)
Admission: RE | Admit: 2021-05-11 | Discharge: 2021-05-11 | Disposition: A | Discharge status: Home / Self Care | Payer: Medicare PPO | Attending: Orthopaedic Surgery | Admitting: Orthopaedic Surgery

## 2021-05-11 ENCOUNTER — Other Ambulatory Visit: Payer: Self-pay

## 2021-05-11 ENCOUNTER — Encounter (HOSPITAL_BASED_OUTPATIENT_CLINIC_OR_DEPARTMENT_OTHER): Payer: Self-pay | Admitting: Orthopaedic Surgery

## 2021-05-11 ENCOUNTER — Ambulatory Visit (HOSPITAL_BASED_OUTPATIENT_CLINIC_OR_DEPARTMENT_OTHER): Payer: Medicare PPO | Admitting: Certified Registered"

## 2021-05-11 DIAGNOSIS — M12811 Other specific arthropathies, not elsewhere classified, right shoulder: Secondary | ICD-10-CM

## 2021-05-11 DIAGNOSIS — Z01818 Encounter for other preprocedural examination: Secondary | ICD-10-CM

## 2021-05-11 DIAGNOSIS — M19011 Primary osteoarthritis, right shoulder: Secondary | ICD-10-CM | POA: Insufficient documentation

## 2021-05-11 DIAGNOSIS — W19XXXA Unspecified fall, initial encounter: Secondary | ICD-10-CM | POA: Insufficient documentation

## 2021-05-11 DIAGNOSIS — E039 Hypothyroidism, unspecified: Secondary | ICD-10-CM | POA: Insufficient documentation

## 2021-05-11 HISTORY — DX: Pure hypercholesterolemia, unspecified: E78.00

## 2021-05-11 HISTORY — DX: Basal cell carcinoma of skin, unspecified: C44.91

## 2021-05-11 HISTORY — DX: Alcohol abuse, uncomplicated: F10.10

## 2021-05-11 HISTORY — DX: Other specified disorders of bone density and structure, unspecified site: M85.80

## 2021-05-11 HISTORY — DX: Depression, unspecified: F32.A

## 2021-05-11 HISTORY — PX: REVERSE SHOULDER ARTHROPLASTY: SHX5054

## 2021-05-11 HISTORY — DX: Hypothyroidism, unspecified: E03.9

## 2021-05-11 SURGERY — ARTHROPLASTY, SHOULDER, TOTAL, REVERSE
Anesthesia: General | Site: Shoulder | Laterality: Right

## 2021-05-11 MED ORDER — PHENYLEPHRINE HCL (PRESSORS) 10 MG/ML IV SOLN
INTRAVENOUS | Status: AC
  Filled 2021-05-11: qty 1

## 2021-05-11 MED ORDER — FENTANYL CITRATE (PF) 100 MCG/2ML IJ SOLN
INTRAMUSCULAR | Status: AC
  Filled 2021-05-11: qty 2

## 2021-05-11 MED ORDER — VANCOMYCIN HCL 1000 MG IV SOLR
INTRAVENOUS | Status: AC
  Filled 2021-05-11: qty 20

## 2021-05-11 MED ORDER — ONDANSETRON HCL 4 MG/2ML IJ SOLN
INTRAMUSCULAR | Status: DC | PRN
  Administered 2021-05-11: 4 mg via INTRAVENOUS

## 2021-05-11 MED ORDER — ROCURONIUM BROMIDE 100 MG/10ML IV SOLN
INTRAVENOUS | Status: DC | PRN
Start: 2021-05-11 — End: 2021-05-11
  Administered 2021-05-11: 50 mg via INTRAVENOUS

## 2021-05-11 MED ORDER — EPHEDRINE SULFATE (PRESSORS) 50 MG/ML IJ SOLN
INTRAMUSCULAR | Status: DC | PRN
  Administered 2021-05-11: 10 mg via INTRAVENOUS
  Administered 2021-05-11: 5 mg via INTRAVENOUS

## 2021-05-11 MED ORDER — SODIUM CHLORIDE 0.9 % IR SOLN
Status: DC | PRN
  Administered 2021-05-11: 1000 mL

## 2021-05-11 MED ORDER — ACETAMINOPHEN 500 MG PO TABS: 1000.0000 mg | ORAL_TABLET | Freq: Three times a day (TID) | ORAL | 0 refills | Status: AC

## 2021-05-11 MED ORDER — DEXAMETHASONE SODIUM PHOSPHATE 10 MG/ML IJ SOLN
INTRAMUSCULAR | Status: AC
  Filled 2021-05-11: qty 1

## 2021-05-11 MED ORDER — OMEPRAZOLE 20 MG PO CPDR: 20.0000 mg | DELAYED_RELEASE_CAPSULE | Freq: Every day | ORAL | 0 refills | Status: AC

## 2021-05-11 MED ORDER — BUPIVACAINE HCL (PF) 0.5 % IJ SOLN
INTRAMUSCULAR | Status: DC | PRN
Start: 2021-05-11 — End: 2021-05-11
  Administered 2021-05-11: 15 mL via PERINEURAL

## 2021-05-11 MED ORDER — PHENYLEPHRINE HCL-NACL 20-0.9 MG/250ML-% IV SOLN
INTRAVENOUS | Status: DC | PRN
  Administered 2021-05-11: 25 ug/min via INTRAVENOUS

## 2021-05-11 MED ORDER — LACTATED RINGERS IV BOLUS
250.0000 mL | Freq: Once | INTRAVENOUS | Status: DC
Start: 2021-05-11 — End: 2021-05-11

## 2021-05-11 MED ORDER — ONDANSETRON HCL 4 MG/2ML IJ SOLN
INTRAMUSCULAR | Status: AC
  Filled 2021-05-11: qty 2

## 2021-05-11 MED ORDER — OXYCODONE HCL 5 MG PO TABS: ORAL_TABLET | ORAL | 0 refills | Status: AC

## 2021-05-11 MED ORDER — CEFAZOLIN SODIUM-DEXTROSE 2-4 GM/100ML-% IV SOLN
2.0000 g | INTRAVENOUS | Status: AC
  Administered 2021-05-11: 2 g via INTRAVENOUS

## 2021-05-11 MED ORDER — ONDANSETRON HCL 4 MG PO TABS: 4.0000 mg | ORAL_TABLET | Freq: Three times a day (TID) | ORAL | 0 refills | Status: AC | PRN

## 2021-05-11 MED ORDER — ONDANSETRON HCL 4 MG/2ML IJ SOLN: 4.0000 mg | Freq: Once | INTRAMUSCULAR | Status: DC | PRN

## 2021-05-11 MED ORDER — MIDAZOLAM HCL 2 MG/2ML IJ SOLN
INTRAMUSCULAR | Status: AC
  Filled 2021-05-11: qty 2

## 2021-05-11 MED ORDER — LIDOCAINE 2% (20 MG/ML) 5 ML SYRINGE
INTRAMUSCULAR | Status: DC | PRN
  Administered 2021-05-11: 20 mg via INTRAVENOUS

## 2021-05-11 MED ORDER — SUGAMMADEX SODIUM 200 MG/2ML IV SOLN
INTRAVENOUS | Status: DC | PRN
  Administered 2021-05-11: 200 mg via INTRAVENOUS

## 2021-05-11 MED ORDER — VANCOMYCIN HCL 1000 MG IV SOLR
INTRAVENOUS | Status: DC | PRN
Start: 2021-05-11 — End: 2021-05-11
  Administered 2021-05-11: 1000 mg

## 2021-05-11 MED ORDER — DEXAMETHASONE SODIUM PHOSPHATE 10 MG/ML IJ SOLN
INTRAMUSCULAR | Status: DC | PRN
  Administered 2021-05-11: 5 mg via INTRAVENOUS

## 2021-05-11 MED ORDER — TRANEXAMIC ACID-NACL 1000-0.7 MG/100ML-% IV SOLN
1000.0000 mg | INTRAVENOUS | Status: AC
  Administered 2021-05-11: 1000 mg via INTRAVENOUS

## 2021-05-11 MED ORDER — OXYCODONE HCL 5 MG PO TABS: 5.0000 mg | ORAL_TABLET | Freq: Once | ORAL | Status: DC | PRN

## 2021-05-11 MED ORDER — LACTATED RINGERS IV SOLN: INTRAVENOUS | Status: DC

## 2021-05-11 MED ORDER — ATROPINE SULFATE 0.4 MG/ML IV SOLN
INTRAVENOUS | Status: AC
  Filled 2021-05-11: qty 1

## 2021-05-11 MED ORDER — PROPOFOL 10 MG/ML IV BOLUS
INTRAVENOUS | Status: AC
  Filled 2021-05-11: qty 20

## 2021-05-11 MED ORDER — ROCURONIUM BROMIDE 10 MG/ML (PF) SYRINGE
PREFILLED_SYRINGE | INTRAVENOUS | Status: AC
Start: 2021-05-11 — End: ?
  Filled 2021-05-11: qty 10

## 2021-05-11 MED ORDER — LACTATED RINGERS IV BOLUS: 500.0000 mL | Freq: Once | INTRAVENOUS | Status: DC

## 2021-05-11 MED ORDER — PROPOFOL 10 MG/ML IV BOLUS
INTRAVENOUS | Status: DC | PRN
  Administered 2021-05-11: 140 mg via INTRAVENOUS

## 2021-05-11 MED ORDER — GABAPENTIN 300 MG PO CAPS
ORAL_CAPSULE | ORAL | Status: AC
  Filled 2021-05-11: qty 1

## 2021-05-11 MED ORDER — ACETAMINOPHEN 500 MG PO TABS
ORAL_TABLET | ORAL | Status: AC
  Filled 2021-05-11: qty 2

## 2021-05-11 MED ORDER — OXYCODONE HCL 5 MG/5ML PO SOLN: 5.0000 mg | Freq: Once | ORAL | Status: DC | PRN

## 2021-05-11 MED ORDER — BUPIVACAINE LIPOSOME 1.3 % IJ SUSP
INTRAMUSCULAR | Status: DC | PRN
Start: 2021-05-11 — End: 2021-05-11
  Administered 2021-05-11: 10 mL via PERINEURAL

## 2021-05-11 MED ORDER — CEFAZOLIN SODIUM-DEXTROSE 2-4 GM/100ML-% IV SOLN
INTRAVENOUS | Status: AC
  Filled 2021-05-11: qty 100

## 2021-05-11 MED ORDER — LIDOCAINE 2% (20 MG/ML) 5 ML SYRINGE
INTRAMUSCULAR | Status: AC
  Filled 2021-05-11: qty 5

## 2021-05-11 MED ORDER — FENTANYL CITRATE (PF) 100 MCG/2ML IJ SOLN: 25.0000 ug | INTRAMUSCULAR | Status: DC | PRN

## 2021-05-11 MED ORDER — PROPOFOL 10 MG/ML IV BOLUS
INTRAVENOUS | Status: AC
Start: 2021-05-11 — End: ?
  Filled 2021-05-11: qty 20

## 2021-05-11 MED ORDER — FENTANYL CITRATE (PF) 100 MCG/2ML IJ SOLN
INTRAMUSCULAR | Status: DC | PRN
  Administered 2021-05-11: 50 ug via INTRAVENOUS

## 2021-05-11 MED ORDER — GABAPENTIN 300 MG PO CAPS
300.0000 mg | ORAL_CAPSULE | Freq: Once | ORAL | Status: AC
  Administered 2021-05-11: 300 mg via ORAL

## 2021-05-11 MED ORDER — MELOXICAM 15 MG PO TABS: 15.0000 mg | ORAL_TABLET | Freq: Every day | ORAL | 0 refills | Status: AC

## 2021-05-11 MED ORDER — FENTANYL CITRATE (PF) 100 MCG/2ML IJ SOLN
50.0000 ug | Freq: Once | INTRAMUSCULAR | Status: AC
  Administered 2021-05-11: 50 ug via INTRAVENOUS

## 2021-05-11 MED ORDER — ACETAMINOPHEN 500 MG PO TABS
1000.0000 mg | ORAL_TABLET | Freq: Once | ORAL | Status: AC
  Administered 2021-05-11: 1000 mg via ORAL

## 2021-05-11 MED ORDER — TRANEXAMIC ACID-NACL 1000-0.7 MG/100ML-% IV SOLN
INTRAVENOUS | Status: AC
  Filled 2021-05-11: qty 100

## 2021-05-11 MED ORDER — ASPIRIN 81 MG PO CHEW: 81.0000 mg | CHEWABLE_TABLET | Freq: Two times a day (BID) | ORAL | 0 refills | Status: AC

## 2021-05-11 SURGICAL SUPPLY — 73 items
AID PSTN UNV HD RSTRNT DISP (MISCELLANEOUS) ×1
APL PRP STRL LF DISP 70% ISPRP (MISCELLANEOUS) ×1
BASEPLATE GLENOID RSA 3X25 0D (Shoulder) ×1 IMPLANT
BIT DRILL 3.2 PERIPHERAL SCREW (BIT) ×1 IMPLANT
BLADE HEX COATED 2.75 (ELECTRODE) IMPLANT
BLADE SAW SAG 73X25 THK (BLADE) ×1
BLADE SAW SGTL 73X25 THK (BLADE) ×1 IMPLANT
BLADE SURG 10 STRL SS (BLADE) IMPLANT
BLADE SURG 15 STRL LF DISP TIS (BLADE) IMPLANT
BLADE SURG 15 STRL SS (BLADE)
BNDG COHESIVE 4X5 TAN ST LF (GAUZE/BANDAGES/DRESSINGS) IMPLANT
BRUSH SCRUB EZ PLAIN DRY (MISCELLANEOUS) ×2 IMPLANT
BSPLAT GLND +3 25 (Shoulder) ×1 IMPLANT
CHLORAPREP W/TINT 26 (MISCELLANEOUS) ×2 IMPLANT
COOLER ICEMAN CLASSIC (MISCELLANEOUS) ×2 IMPLANT
COVER BACK TABLE 60X90IN (DRAPES) ×2 IMPLANT
COVER MAYO STAND STRL (DRAPES) ×2 IMPLANT
DRAPE IMP U-DRAPE 54X76 (DRAPES) ×2 IMPLANT
DRAPE INCISE IOBAN 66X45 STRL (DRAPES) ×2 IMPLANT
DRAPE U-SHAPE 76X120 STRL (DRAPES) ×4 IMPLANT
DRSG AQUACEL AG ADV 3.5X 6 (GAUZE/BANDAGES/DRESSINGS) ×2 IMPLANT
ELECT BLADE 4.0 EZ CLEAN MEGAD (MISCELLANEOUS) ×2
ELECT REM PT RETURN 9FT ADLT (ELECTROSURGICAL) ×2
ELECTRODE BLDE 4.0 EZ CLN MEGD (MISCELLANEOUS) ×1 IMPLANT
ELECTRODE REM PT RTRN 9FT ADLT (ELECTROSURGICAL) ×1 IMPLANT
FACESHIELD WRAPAROUND (MASK) ×4 IMPLANT
FACESHIELD WRAPAROUND OR TEAM (MASK) ×2 IMPLANT
GLENOIDSPHERE LATERALIZED 33 (Joint) ×2 IMPLANT
GLOVE BIO SURGEON STRL SZ 6.5 (GLOVE) ×4 IMPLANT
GLOVE BIOGEL PI IND STRL 6.5 (GLOVE) ×1 IMPLANT
GLOVE BIOGEL PI IND STRL 8 (GLOVE) ×1 IMPLANT
GLOVE BIOGEL PI INDICATOR 6.5 (GLOVE) ×1
GLOVE BIOGEL PI INDICATOR 8 (GLOVE) ×1
GLOVE ECLIPSE 8.0 STRL XLNG CF (GLOVE) ×4 IMPLANT
GOWN STRL REUS W/ TWL LRG LVL3 (GOWN DISPOSABLE) ×2 IMPLANT
GOWN STRL REUS W/TWL LRG LVL3 (GOWN DISPOSABLE) ×4
GOWN STRL REUS W/TWL XL LVL3 (GOWN DISPOSABLE) ×2 IMPLANT
GUIDE PIN 3X75 SHOULDER (PIN) ×2
GUIDEWIRE GLENOID 2.5X220 (WIRE) ×1 IMPLANT
HANDPIECE INTERPULSE COAX TIP (DISPOSABLE) ×2
INSERT HUMERAL SZ1/2 33 (Orthopedic Implant) ×1 IMPLANT
KIT SHOULDER STAB MARCO (KITS) ×2 IMPLANT
MANIFOLD NEPTUNE II (INSTRUMENTS) ×2 IMPLANT
PACK BASIN DAY SURGERY FS (CUSTOM PROCEDURE TRAY) ×2 IMPLANT
PACK SHOULDER (CUSTOM PROCEDURE TRAY) ×2 IMPLANT
PAD COLD SHLDR WRAP-ON (PAD) ×2 IMPLANT
PAD ORTHO SHOULDER 7X19 LRG (SOFTGOODS) ×2 IMPLANT
PENCIL SMOKE EVACUATOR (MISCELLANEOUS) IMPLANT
PIN GUIDE 3X75 SHOULDER (PIN) IMPLANT
RESTRAINT HEAD UNIVERSAL NS (MISCELLANEOUS) ×2 IMPLANT
SCREW 5.0X18 (Screw) ×1 IMPLANT
SCREW 5.5X26 (Screw) ×1 IMPLANT
SCREW BONE THREAD 6.5X35 (Screw) ×1 IMPLANT
SET HNDPC FAN SPRY TIP SCT (DISPOSABLE) ×1 IMPLANT
SHEET MEDIUM DRAPE 40X70 STRL (DRAPES) ×2 IMPLANT
SLEEVE SCD COMPRESS KNEE MED (STOCKING) ×2 IMPLANT
SPHERE GLENOID LATERALIZED 33 (Joint) IMPLANT
SPIKE FLUID TRANSFER (MISCELLANEOUS) IMPLANT
SPONGE T-LAP 18X18 ~~LOC~~+RFID (SPONGE) ×2 IMPLANT
STEM HUMERAL STD SHORT SIZE 2 (Orthopedic Implant) ×1 IMPLANT
STRIP CLOSURE SKIN 1/2X4 (GAUZE/BANDAGES/DRESSINGS) ×2 IMPLANT
SUT ETHIBOND 2 V 37 (SUTURE) ×2 IMPLANT
SUT ETHIBOND NAB CT1 #1 30IN (SUTURE) ×2 IMPLANT
SUT ETHILON 3 0 PS 1 (SUTURE) ×2 IMPLANT
SUT FIBERWIRE #5 38 CONV NDL (SUTURE) ×8
SUT MNCRL AB 4-0 PS2 18 (SUTURE) ×2 IMPLANT
SUT VIC AB 0 CT1 27 (SUTURE)
SUT VIC AB 0 CT1 27XBRD ANBCTR (SUTURE) IMPLANT
SUT VIC AB 3-0 SH 27 (SUTURE) ×2
SUT VIC AB 3-0 SH 27X BRD (SUTURE) ×1 IMPLANT
SUTURE FIBERWR #5 38 CONV NDL (SUTURE) ×4 IMPLANT
TOWEL GREEN STERILE FF (TOWEL DISPOSABLE) ×6 IMPLANT
TUBE SUCTION HIGH CAP CLEAR NV (SUCTIONS) ×2 IMPLANT

## 2021-05-11 NOTE — Anesthesia Procedure Notes (Signed)
Procedure Name: Intubation ?Date/Time: 05/11/2021 7:48 AM ?Performed by: Lavonia Dana, CRNA ?Pre-anesthesia Checklist: Patient identified, Emergency Drugs available, Suction available and Patient being monitored ?Patient Re-evaluated:Patient Re-evaluated prior to induction ?Oxygen Delivery Method: Circle system utilized ?Preoxygenation: Pre-oxygenation with 100% oxygen ?Induction Type: IV induction ?Ventilation: Mask ventilation without difficulty ?Laryngoscope Size: Mac and 3 ?Grade View: Grade I ?Tube type: Oral ?Tube size: 7.0 mm ?Number of attempts: 1 ?Airway Equipment and Method: Stylet and Bite block ?Placement Confirmation: ETT inserted through vocal cords under direct vision, positive ETCO2 and breath sounds checked- equal and bilateral ?Secured at: 22 cm ?Tube secured with: Tape ?Dental Injury: Teeth and Oropharynx as per pre-operative assessment  ? ? ? ? ?

## 2021-05-11 NOTE — Anesthesia Procedure Notes (Signed)
Anesthesia Regional Block: Interscalene brachial plexus block  ? ?Pre-Anesthetic Checklist: , timeout performed,  Correct Patient, Correct Site, Correct Laterality,  Correct Procedure, Correct Position, site marked,  Risks and benefits discussed,  Surgical consent,  Pre-op evaluation,  At surgeon's request and post-op pain management ? ?Laterality: Right ? ?Prep: chloraprep     ?  ?Needles:  ?Injection technique: Single-shot ? ?Needle Type: Echogenic Needle   ? ? ?Needle Length: 5cm  ?Needle Gauge: 21  ? ? ? ?Additional Needles: ? ? ?Narrative:  ?Start time: 05/11/2021 7:10 AM ?End time: 05/11/2021 7:14 AM ?Injection made incrementally with aspirations every 5 mL. ? ?Performed by: Personally  ?Anesthesiologist: Audry Pili, MD ? ?Additional Notes: ?No pain on injection. No increased resistance to injection. Injection made in 5cc increments. Good needle visualization. Patient tolerated the procedure well. ? ? ? ? ?

## 2021-05-11 NOTE — Interval H&P Note (Signed)
All questions answered, patient wants to proceed with procedure. ? ?

## 2021-05-11 NOTE — Transfer of Care (Signed)
Immediate Anesthesia Transfer of Care Note ? ?Patient: Samantha Patel ? ?Procedure(s) Performed: REVERSE SHOULDER ARTHROPLASTY (Right: Shoulder) ? ?Patient Location: PACU ? ?Anesthesia Type:GA combined with regional for post-op pain ? ?Level of Consciousness: drowsy ? ?Airway & Oxygen Therapy: Patient Spontanous Breathing and Patient connected to face mask oxygen ? ?Post-op Assessment: Report given to RN and Post -op Vital signs reviewed and stable ? ?Post vital signs: Reviewed and stable ? ?Last Vitals:  ?Vitals Value Taken Time  ?BP 156/68 05/11/21 0905  ?Temp    ?Pulse 60 05/11/21 0907  ?Resp 16 05/11/21 0907  ?SpO2 96 % 05/11/21 0907  ?Vitals shown include unvalidated device data. ? ?Last Pain:  ?Vitals:  ? 05/11/21 0638  ?TempSrc: Oral  ?PainSc: 0-No pain  ?   ? ?  ? ?Complications: No notable events documented. ?

## 2021-05-11 NOTE — Anesthesia Postprocedure Evaluation (Signed)
Anesthesia Post Note ? ?Patient: Samantha Patel ? ?Procedure(s) Performed: REVERSE SHOULDER ARTHROPLASTY (Right: Shoulder) ? ?  ? ?Patient location during evaluation: PACU ?Anesthesia Type: General ?Level of consciousness: awake and alert ?Pain management: pain level controlled ?Vital Signs Assessment: post-procedure vital signs reviewed and stable ?Respiratory status: spontaneous breathing, nonlabored ventilation and respiratory function stable ?Cardiovascular status: stable and blood pressure returned to baseline ?Anesthetic complications: no ? ? ?No notable events documented. ? ?Last Vitals:  ?Vitals:  ? 05/11/21 0952 05/11/21 1002  ?BP:    ?Pulse: 63 66  ?Resp: 13 16  ?Temp:  36.7 ?C  ?SpO2: 95% 95%  ?  ?Last Pain:  ?Vitals:  ? 05/11/21 1002  ?TempSrc:   ?PainSc: 0-No pain  ? ? ?  ?  ?  ?  ?  ?  ? ?Audry Pili ? ? ? ? ?

## 2021-05-11 NOTE — Op Note (Signed)
Orthopaedic Surgery Operative Note (CSN: [id number]) ? ?Samantha Patel  [birth year] ?Date of Surgery: 05/11/2021 ? ? ?Diagnoses:  ?Right shoulder cuff tear arthropathy ? ?Procedure: ?Right reverse lateralized total Shoulder Arthroplasty ?  ?Operative Finding ?Successful completion of planned procedure.  Patient had no superior cuff intact.  She had a remnant of scarred down subscapularis that was able to be repaired.  Axillary nerve tug test was normal.  Her bone quality was poor and though we reamed for a size 1 implant we actually put in a size 2.  Overall stable construct. ? ?Post-operative plan: The patient will be NWB in sling.  The patient will be will be discharged from PACU if continues to be stable as was plan prior to surgery.  DVT prophylaxis Aspirin 81 mg twice daily for 6 weeks.  Pain control with PRN pain medication preferring oral medicines.  Follow up plan will be scheduled in approximately 7 days for incision check and XR.  Physical therapy to start as soon as possible. ? ?Implants: Tornier size to perform humeral stem, +3 polyethylene, 33+3 glenosphere, 25+3 baseplate with a 35 center screw and 2 peripheral locking screws. ? ?Post-Op Diagnosis: Same ?Surgeons:Primary: Hiram Gash, MD ?Assistants:Caroline McBane PA-C ?Location: [facility] OR ROOM 6 ?Anesthesia: General with Exparel Interscalene ?Antibiotics: Ancef 2g preop, Vancomycin '1000mg'$  locally ?Tourniquet time: None ?Estimated Blood Loss: 100 ?Complications: None ?Specimens: None ?Implants: ?Implant Name Type Inv. Item Serial No. Manufacturer Lot No. LRB No. Used Action  ?BASEPLATE GLENOID RSA 2W58 0D - [id number] Shoulder BASEPLATE GLENOID RSA 0D98 0D [id number] [employer]  Right 1 Implanted  ?GLENOIDSPHERE LATERALIZED 33 - [id number] Joint GLENOIDSPHERE LATERALIZED 33 [id number] [employer]  Right 1 Implanted  ?BONE SCREW THREAD 6.5X35MM - [id number] Screw BONE SCREW THREAD 6.5X35MM  [employer] STERILIZED IN TRAY Right 1 Implanted  ?SCREW 5.5X26 -  [id number] Screw SCREW 5.5X26  [employer] STERILIZED IN TRAY Right 1 Implanted  ?SCREW 5.0X18 - [id number] Screw SCREW 5.0X18  [employer] STERILIZED IN TRAY Right 1 Implanted  ?INSERT HUMERAL SZ1/2 33 - [id number] Orthopedic Implant INSERT HUMERAL SZ1/2 33 [id number] [employer]  Right 1 Implanted  ?STEM HUMERAL STD SHORT SIZE 2 - [id number] Orthopedic Implant STEM HUMERAL STD SHORT SIZE 2 [id number] [employer]  Right 1 Implanted  ? ? ?Indications for Surgery:   ?Samantha Patel is a 76 y.o. 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  Benefits and risks of operative and nonoperative management were discussed prior to surgery with patient/guardian(s) and informed consent form was completed.  Infection and need for further surgery were discussed as was prosthetic stability and cuff issues.  We additionally specifically discussed risks of axillary nerve injury, infection, periprosthetic fracture, continued pain and longevity of implants prior to beginning procedure.   ? ? ? ?Procedure:   ?The patient was identified in the preoperative holding area where the surgical site was marked. Block placed by anesthesia with exparel.  The patient was taken to the OR where a procedural timeout was called and the above noted anesthesia was induced.  The patient was positioned beachchair on allen table with spider arm positioner.  Preoperative antibiotics were dosed.  The patient's right shoulder was prepped and draped in the usual sterile fashion.  A second preoperative timeout was called.     ? ? ?Standard deltopectoral approach was performed with a #10 blade. We dissected down to the subcutaneous tissues and the cephalic vein was taken laterally with the deltoid. Clavipectoral fascia was  incised in line with the incision. Deep retractors were placed. The long of the biceps tendon was identified and there was significant tenosynovitis present.  Tenodesis was performed to the pectoralis tendon with #2  Ethibond. The remaining biceps was followed up into the rotator interval where it was released.  ? ?The subscapularis was taken down in a full thickness layer with capsule along the humeral neck extending inferiorly around the humeral head. We continued releasing the capsule directly off of the osteophytes inferiorly all the way around the corner. This allowed [place] to dislocate the humeral head.  ? ?The humeral head had evidence of severe osteoarthritic wear with full-thickness cartilage loss and exposed subchondral bone. There was significant flattening of the humeral head.  ? ?The rotator cuff was carefully examined and noted to be irreperably torn.  The decision was confirmed that a reverse total shoulder was indicated for this patient. ? ?There were osteophytes along the inferior humeral neck. The osteophytes were removed with an osteotome and a rongeur.  Osteophytes were removed with a rongeur and an osteotome and the anatomic neck was well visualized.    ? ?A humeral cutting guide was used extra medullary with a pin to help control version. The version was set at 20? of retroversion. Humeral osteotomy was performed with an oscillating saw. The head fragment was passed off the back table.  A cut protector plate was placed. ? ?The subscapularis was again identified and immediately we took care to palpate the axillary nerve anteriorly and verify its position with gentle palpation as well as the tug test.  We then released the SGHL with bovie cautery prior to placing a curved mayo at the junction of the anterior glenoid well above the axillary nerve and bluntly dissecting the subscapularis from the capsule.  We then carefully protected the axillary nerve as we gently released the inferior capsule to fully mobilize the subscapularis.  An anterior deltoid retractor was then placed as well as a small Hohmann retractor superiorly. ? ? ?The glenoid was relatively intact in the setting of an irreparable cuff tear ? ?The  remaining labrum was removed circumferentially taking great care not to disrupt the posterior capsule.  ? ?The glenoid drill guide was placed and used to drill a guide pin in the center, inferior position. The glenoid face was then reamed concentrically over the guide wire. The center hole was drilled over the guidepin in a near anatomic angle of version. Next the glenoid vault was drilled back to a depth of 35 mm.  We tapped and then placed a 3m size baseplate with additional 381mlateralization was selected with a 6.5 mm x 35 mm length central screw.  The base plate was screwed into the glenoid vault obtaining secure fixation. We next placed superior and inferior locking screws for additional fixation.  Next a 33+3 mm glenosphere was selected and impacted onto the baseplate. The center screw was tightened. ? ?We turned attention back to the humeral side. The cut protector was removed.  We used the perform humeral sizing block to select the appropriate size which for this patient was a 1.  We then placed our center pin and reamed over it concentrically obtaining appropriate inset.  We then used our lateralizing chisel to prepare the lateral aspect of the humerus.  At that point we selected the appropriate implant trialing a 2 as the bone quality was soft..  Using this trial implant we trialed multiple polyethylene sizes settling on a +3 which provided  good stability and range of motion without excess soft tissue tension. The offset was dialed in to match the normal anatomy. The shoulder was trialed.  There was good ROM in all planes and the shoulder was stable with no inferior translation. ? ?The real humeral implants were opened after again confirming sizes.  The trial was removed. #5 Fiberwire x4 sutures passed through the humeral neck for subscap repair. The humeral component was press-fit obtaining a secure fit. The joint was reduced and thoroughly irrigated with pulsatile lavage. Subscap was repaired back  with #5 Fiberwire sutures through bone tunnels. Hemostasis was obtained. The deltopectoral interval was reapproximated with #1 Ethibond. The subcutaneous tissues were closed with 2-0 Vicryl and the skin was close

## 2021-05-11 NOTE — Anesthesia Preprocedure Evaluation (Addendum)
Anesthesia Evaluation  ?Patient identified by MRN, date of birth, ID band ?Patient awake ? ? ? ?Reviewed: ?Allergy & Precautions, NPO status , Patient's Chart, lab work & pertinent test results ? ?History of Anesthesia Complications ?Negative for: history of anesthetic complications ? ?Airway ?Mallampati: II ? ?TM Distance: >3 FB ?Neck ROM: Full ? ? ? Dental ? ?(+) Dental Advisory Given, Teeth Intact ?  ?Pulmonary ?neg pulmonary ROS,  ?  ?Pulmonary exam normal ? ? ? ? ? ? ? Cardiovascular ?negative cardio ROS ?Normal cardiovascular exam ? ? ?  ?Neuro/Psych ?PSYCHIATRIC DISORDERS Depression negative neurological ROS ?   ? GI/Hepatic ?negative GI ROS, (+)  ?  ? substance abuse ? alcohol use,   ?Endo/Other  ?Hypothyroidism  ? Renal/GU ?negative Renal ROS  ? ?  ?Musculoskeletal ?negative musculoskeletal ROS ?(+)  ? Abdominal ?  ?Peds ? Hematology ?negative hematology ROS ?(+)   ?Anesthesia Other Findings ? ? Reproductive/Obstetrics ? ?  ? ? ? ? ? ? ? ? ? ? ? ? ? ?  ?  ? ? ? ? ? ? ? ?Anesthesia Physical ?Anesthesia Plan ? ?ASA: 2 ? ?Anesthesia Plan: General  ? ?Post-op Pain Management: Tylenol PO (pre-op)* and Regional block*  ? ?Induction: Intravenous ? ?PONV Risk Score and Plan: 3 and Treatment may vary due to age or medical condition, Ondansetron and Dexamethasone ? ?Airway Management Planned: Oral ETT ? ?Additional Equipment: None ? ?Intra-op Plan:  ? ?Post-operative Plan: Extubation in OR ? ?Informed Consent: I have reviewed the patients History and Physical, chart, labs and discussed the procedure including the risks, benefits and alternatives for the proposed anesthesia with the patient or authorized representative who has indicated his/her understanding and acceptance.  ? ? ? ?Dental advisory given ? ?Plan Discussed with: CRNA and Anesthesiologist ? ?Anesthesia Plan Comments:   ? ? ? ? ? ?Anesthesia Quick Evaluation ? ?

## 2021-05-11 NOTE — Progress Notes (Signed)
Assisted Dr. Fransisco Beau with right, interscalene  block. Side rails up, monitors on throughout procedure. See vital signs in flow sheet. Tolerated Procedure well. ?

## 2021-05-15 ENCOUNTER — Encounter (HOSPITAL_BASED_OUTPATIENT_CLINIC_OR_DEPARTMENT_OTHER): Payer: Self-pay | Admitting: Orthopaedic Surgery

## 2021-11-16 IMAGING — DX DG CHEST 2V
2 series · 2 of 2 positions shown · non-contrast
Comparison: None.

CLINICAL DATA: Chronic cough.

EXAM:
CHEST - 2 VIEW

[dg chest 2 view (1 of 2)]
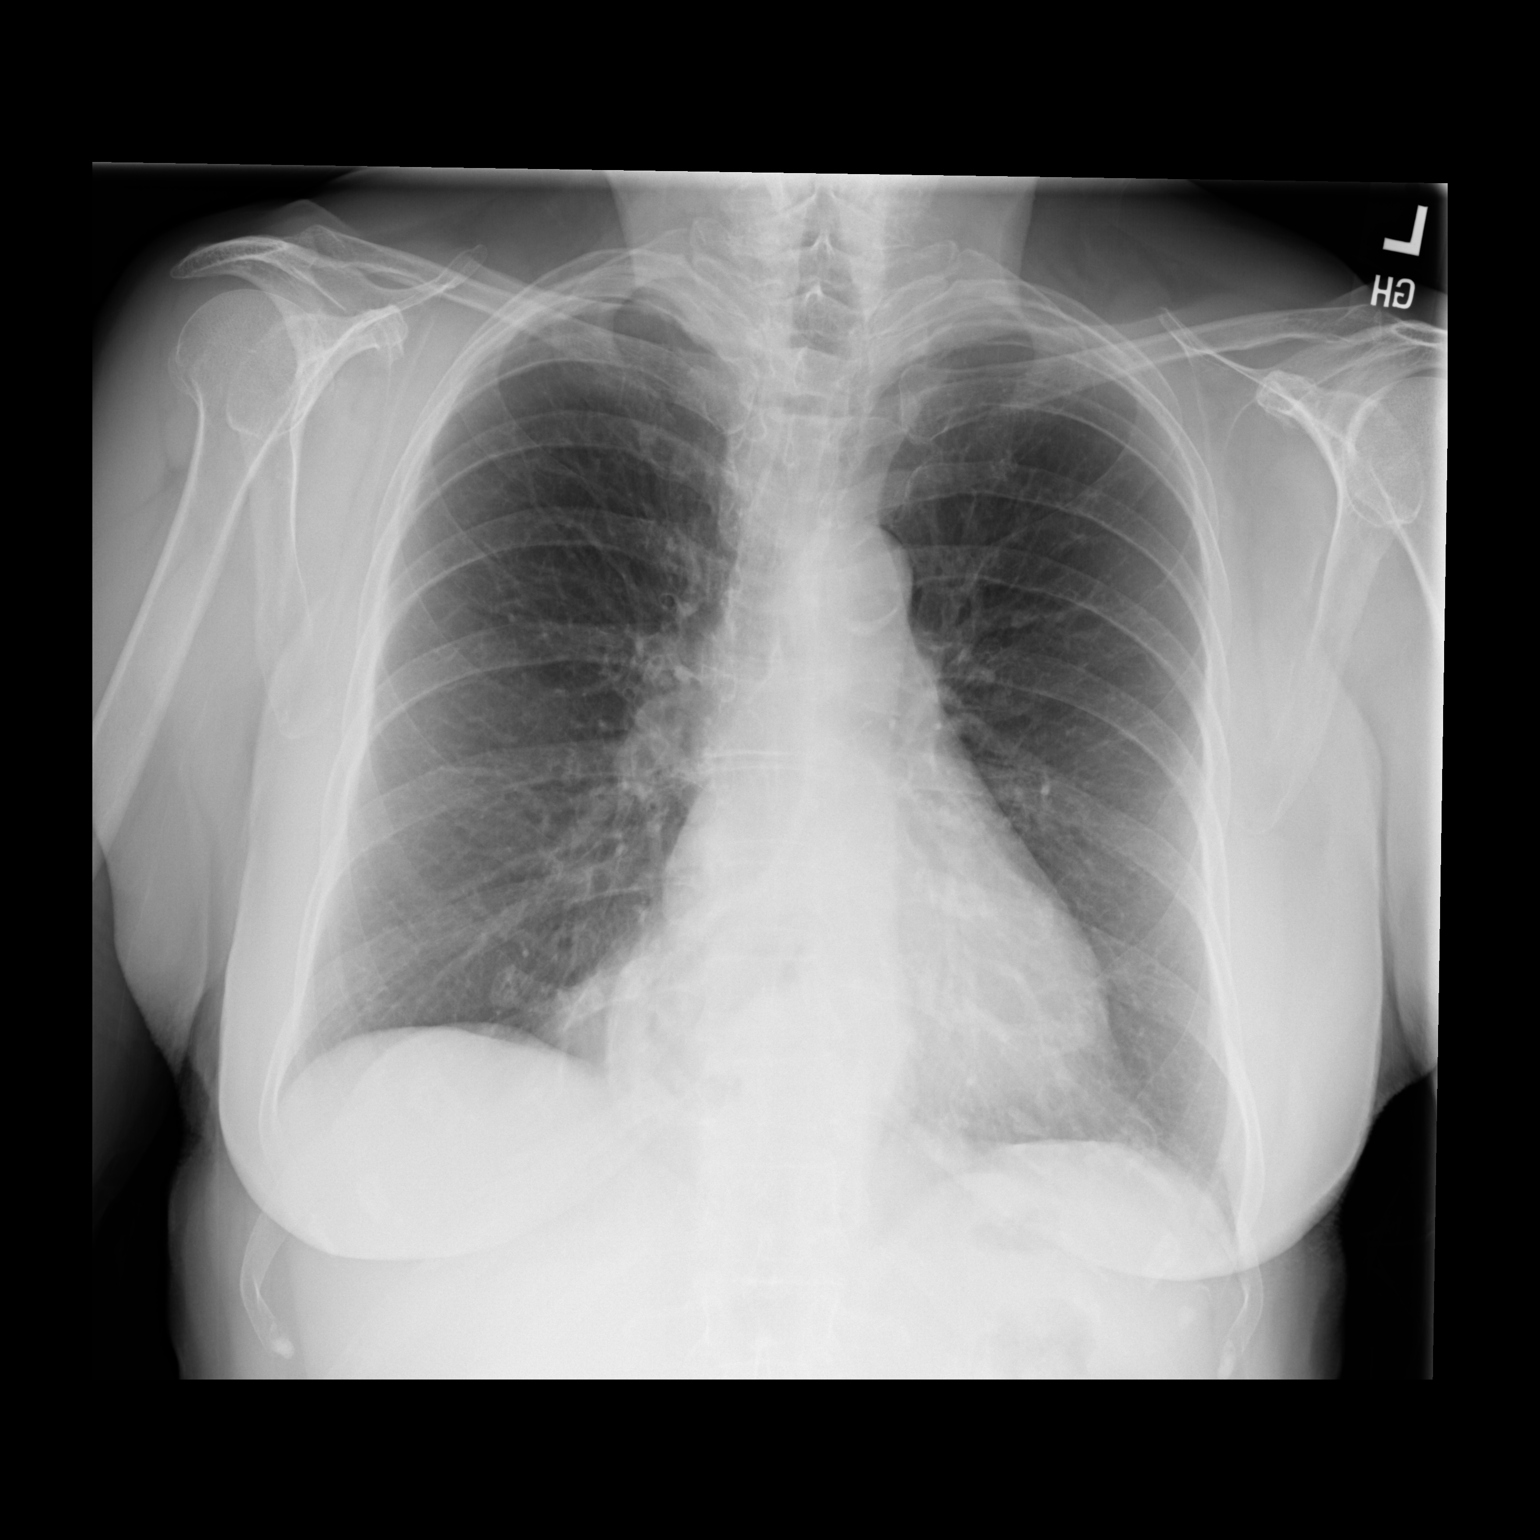

[dg chest 2 view (2 of 2)]
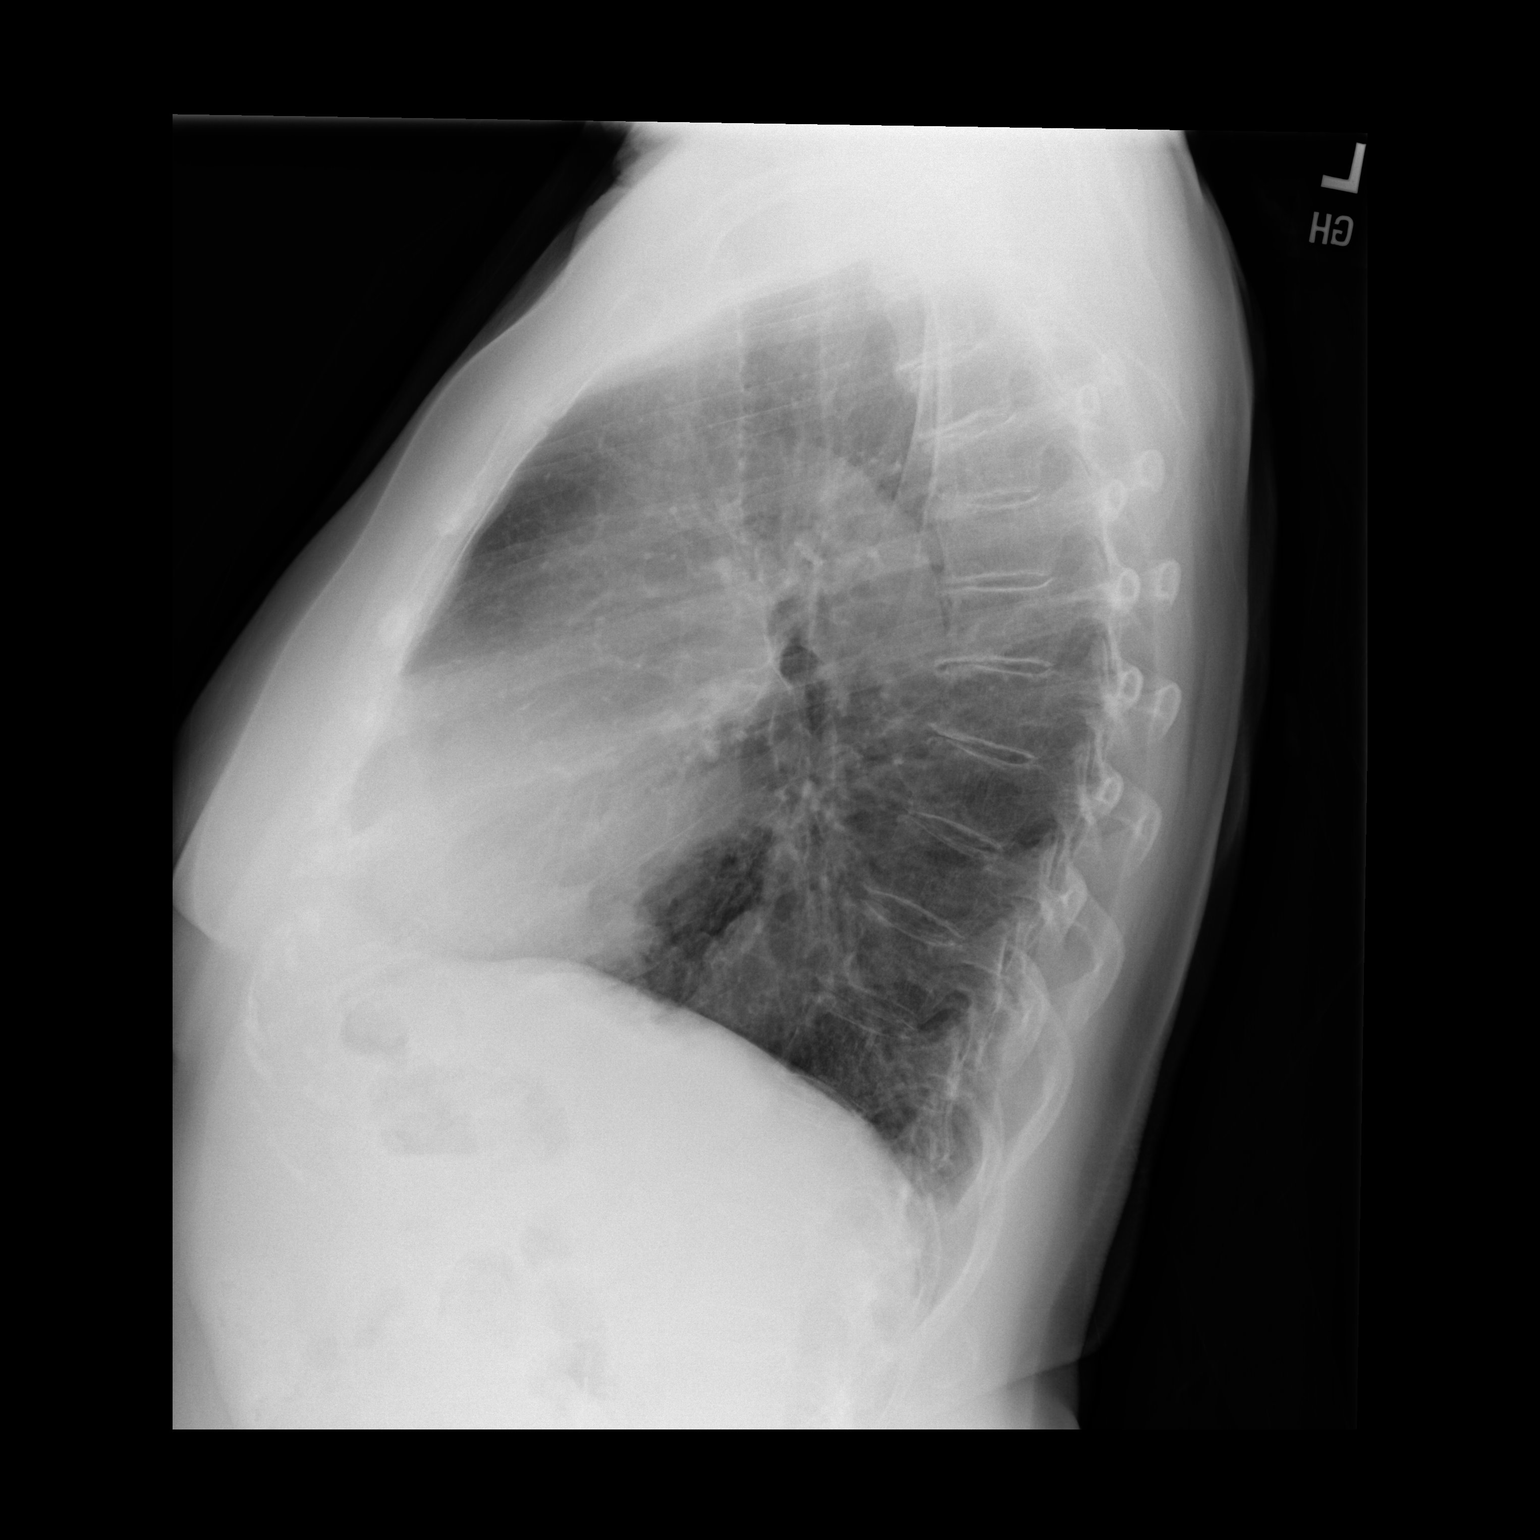

[2 of 2 positions shown; findings below may reference images not displayed]

FINDINGS: Normal heart size. Moderate to large hiatal hernia. Atherosclerotic
calcification of the aortic arch. Normal pulmonary vascularity. No
focal consolidation, pleural effusion, or pneumothorax. No acute
osseous abnormality.
IMPRESSION: No active cardiopulmonary disease.

## 2022-04-02 DIAGNOSIS — Z1231 Encounter for screening mammogram for malignant neoplasm of breast: Secondary | ICD-10-CM | POA: Diagnosis not present

## 2022-05-01 DIAGNOSIS — S39012A Strain of muscle, fascia and tendon of lower back, initial encounter: Secondary | ICD-10-CM | POA: Diagnosis not present

## 2022-05-01 DIAGNOSIS — M9901 Segmental and somatic dysfunction of cervical region: Secondary | ICD-10-CM | POA: Diagnosis not present

## 2022-05-01 DIAGNOSIS — M9903 Segmental and somatic dysfunction of lumbar region: Secondary | ICD-10-CM | POA: Diagnosis not present

## 2022-05-01 DIAGNOSIS — M9902 Segmental and somatic dysfunction of thoracic region: Secondary | ICD-10-CM | POA: Diagnosis not present

## 2022-05-01 DIAGNOSIS — S138XXA Sprain of joints and ligaments of other parts of neck, initial encounter: Secondary | ICD-10-CM | POA: Diagnosis not present

## 2022-05-01 DIAGNOSIS — S29012A Strain of muscle and tendon of back wall of thorax, initial encounter: Secondary | ICD-10-CM | POA: Diagnosis not present

## 2022-05-11 DIAGNOSIS — M75111 Incomplete rotator cuff tear or rupture of right shoulder, not specified as traumatic: Secondary | ICD-10-CM | POA: Diagnosis not present

## 2022-05-11 DIAGNOSIS — M25561 Pain in right knee: Secondary | ICD-10-CM | POA: Diagnosis not present

## 2022-05-29 DIAGNOSIS — S39012A Strain of muscle, fascia and tendon of lower back, initial encounter: Secondary | ICD-10-CM | POA: Diagnosis not present

## 2022-05-29 DIAGNOSIS — M9901 Segmental and somatic dysfunction of cervical region: Secondary | ICD-10-CM | POA: Diagnosis not present

## 2022-05-29 DIAGNOSIS — S29012A Strain of muscle and tendon of back wall of thorax, initial encounter: Secondary | ICD-10-CM | POA: Diagnosis not present

## 2022-05-29 DIAGNOSIS — S138XXA Sprain of joints and ligaments of other parts of neck, initial encounter: Secondary | ICD-10-CM | POA: Diagnosis not present

## 2022-05-29 DIAGNOSIS — M9903 Segmental and somatic dysfunction of lumbar region: Secondary | ICD-10-CM | POA: Diagnosis not present

## 2022-05-29 DIAGNOSIS — M9902 Segmental and somatic dysfunction of thoracic region: Secondary | ICD-10-CM | POA: Diagnosis not present

## 2022-06-26 DIAGNOSIS — S29012A Strain of muscle and tendon of back wall of thorax, initial encounter: Secondary | ICD-10-CM | POA: Diagnosis not present

## 2022-06-26 DIAGNOSIS — M9903 Segmental and somatic dysfunction of lumbar region: Secondary | ICD-10-CM | POA: Diagnosis not present

## 2022-06-26 DIAGNOSIS — S138XXA Sprain of joints and ligaments of other parts of neck, initial encounter: Secondary | ICD-10-CM | POA: Diagnosis not present

## 2022-06-26 DIAGNOSIS — S39012A Strain of muscle, fascia and tendon of lower back, initial encounter: Secondary | ICD-10-CM | POA: Diagnosis not present

## 2022-06-26 DIAGNOSIS — M9901 Segmental and somatic dysfunction of cervical region: Secondary | ICD-10-CM | POA: Diagnosis not present

## 2022-06-26 DIAGNOSIS — M9902 Segmental and somatic dysfunction of thoracic region: Secondary | ICD-10-CM | POA: Diagnosis not present

## 2022-07-24 DIAGNOSIS — M9902 Segmental and somatic dysfunction of thoracic region: Secondary | ICD-10-CM | POA: Diagnosis not present

## 2022-07-24 DIAGNOSIS — S39012A Strain of muscle, fascia and tendon of lower back, initial encounter: Secondary | ICD-10-CM | POA: Diagnosis not present

## 2022-07-24 DIAGNOSIS — S138XXA Sprain of joints and ligaments of other parts of neck, initial encounter: Secondary | ICD-10-CM | POA: Diagnosis not present

## 2022-07-24 DIAGNOSIS — M9903 Segmental and somatic dysfunction of lumbar region: Secondary | ICD-10-CM | POA: Diagnosis not present

## 2022-07-24 DIAGNOSIS — S29012A Strain of muscle and tendon of back wall of thorax, initial encounter: Secondary | ICD-10-CM | POA: Diagnosis not present

## 2022-07-24 DIAGNOSIS — M9901 Segmental and somatic dysfunction of cervical region: Secondary | ICD-10-CM | POA: Diagnosis not present

## 2022-08-28 DIAGNOSIS — S29012A Strain of muscle and tendon of back wall of thorax, initial encounter: Secondary | ICD-10-CM | POA: Diagnosis not present

## 2022-08-28 DIAGNOSIS — S39012A Strain of muscle, fascia and tendon of lower back, initial encounter: Secondary | ICD-10-CM | POA: Diagnosis not present

## 2022-08-28 DIAGNOSIS — M9902 Segmental and somatic dysfunction of thoracic region: Secondary | ICD-10-CM | POA: Diagnosis not present

## 2022-08-28 DIAGNOSIS — M9901 Segmental and somatic dysfunction of cervical region: Secondary | ICD-10-CM | POA: Diagnosis not present

## 2022-08-28 DIAGNOSIS — S138XXA Sprain of joints and ligaments of other parts of neck, initial encounter: Secondary | ICD-10-CM | POA: Diagnosis not present

## 2022-08-28 DIAGNOSIS — M9903 Segmental and somatic dysfunction of lumbar region: Secondary | ICD-10-CM | POA: Diagnosis not present

## 2022-09-13 DIAGNOSIS — H40012 Open angle with borderline findings, low risk, left eye: Secondary | ICD-10-CM | POA: Diagnosis not present

## 2022-09-25 DIAGNOSIS — M9903 Segmental and somatic dysfunction of lumbar region: Secondary | ICD-10-CM | POA: Diagnosis not present

## 2022-09-25 DIAGNOSIS — S39012A Strain of muscle, fascia and tendon of lower back, initial encounter: Secondary | ICD-10-CM | POA: Diagnosis not present

## 2022-09-25 DIAGNOSIS — S29012A Strain of muscle and tendon of back wall of thorax, initial encounter: Secondary | ICD-10-CM | POA: Diagnosis not present

## 2022-09-25 DIAGNOSIS — M9901 Segmental and somatic dysfunction of cervical region: Secondary | ICD-10-CM | POA: Diagnosis not present

## 2022-09-25 DIAGNOSIS — M9902 Segmental and somatic dysfunction of thoracic region: Secondary | ICD-10-CM | POA: Diagnosis not present

## 2022-09-25 DIAGNOSIS — S138XXA Sprain of joints and ligaments of other parts of neck, initial encounter: Secondary | ICD-10-CM | POA: Diagnosis not present

## 2022-10-30 DIAGNOSIS — S39012A Strain of muscle, fascia and tendon of lower back, initial encounter: Secondary | ICD-10-CM | POA: Diagnosis not present

## 2022-10-30 DIAGNOSIS — M9903 Segmental and somatic dysfunction of lumbar region: Secondary | ICD-10-CM | POA: Diagnosis not present

## 2022-10-30 DIAGNOSIS — M9902 Segmental and somatic dysfunction of thoracic region: Secondary | ICD-10-CM | POA: Diagnosis not present

## 2022-10-30 DIAGNOSIS — S29012A Strain of muscle and tendon of back wall of thorax, initial encounter: Secondary | ICD-10-CM | POA: Diagnosis not present

## 2022-10-30 DIAGNOSIS — S138XXA Sprain of joints and ligaments of other parts of neck, initial encounter: Secondary | ICD-10-CM | POA: Diagnosis not present

## 2022-10-30 DIAGNOSIS — M9901 Segmental and somatic dysfunction of cervical region: Secondary | ICD-10-CM | POA: Diagnosis not present

## 2022-11-27 DIAGNOSIS — S39012A Strain of muscle, fascia and tendon of lower back, initial encounter: Secondary | ICD-10-CM | POA: Diagnosis not present

## 2022-11-27 DIAGNOSIS — M9903 Segmental and somatic dysfunction of lumbar region: Secondary | ICD-10-CM | POA: Diagnosis not present

## 2022-11-27 DIAGNOSIS — M9902 Segmental and somatic dysfunction of thoracic region: Secondary | ICD-10-CM | POA: Diagnosis not present

## 2022-11-27 DIAGNOSIS — S29012A Strain of muscle and tendon of back wall of thorax, initial encounter: Secondary | ICD-10-CM | POA: Diagnosis not present

## 2022-11-27 DIAGNOSIS — S138XXA Sprain of joints and ligaments of other parts of neck, initial encounter: Secondary | ICD-10-CM | POA: Diagnosis not present

## 2022-11-27 DIAGNOSIS — M9901 Segmental and somatic dysfunction of cervical region: Secondary | ICD-10-CM | POA: Diagnosis not present

## 2022-12-05 DIAGNOSIS — F322 Major depressive disorder, single episode, severe without psychotic features: Secondary | ICD-10-CM | POA: Diagnosis not present

## 2022-12-05 DIAGNOSIS — Z0000 Encounter for general adult medical examination without abnormal findings: Secondary | ICD-10-CM | POA: Diagnosis not present

## 2022-12-05 DIAGNOSIS — R7303 Prediabetes: Secondary | ICD-10-CM | POA: Diagnosis not present

## 2022-12-05 DIAGNOSIS — E7800 Pure hypercholesterolemia, unspecified: Secondary | ICD-10-CM | POA: Diagnosis not present

## 2022-12-05 DIAGNOSIS — N952 Postmenopausal atrophic vaginitis: Secondary | ICD-10-CM | POA: Diagnosis not present

## 2022-12-05 DIAGNOSIS — E039 Hypothyroidism, unspecified: Secondary | ICD-10-CM | POA: Diagnosis not present

## 2022-12-05 DIAGNOSIS — D509 Iron deficiency anemia, unspecified: Secondary | ICD-10-CM | POA: Diagnosis not present

## 2022-12-05 DIAGNOSIS — K219 Gastro-esophageal reflux disease without esophagitis: Secondary | ICD-10-CM | POA: Diagnosis not present

## 2022-12-05 DIAGNOSIS — E2839 Other primary ovarian failure: Secondary | ICD-10-CM | POA: Diagnosis not present

## 2022-12-06 ENCOUNTER — Other Ambulatory Visit: Payer: Self-pay | Admitting: Family Medicine

## 2022-12-06 DIAGNOSIS — R2232 Localized swelling, mass and lump, left upper limb: Secondary | ICD-10-CM

## 2022-12-11 DIAGNOSIS — H25043 Posterior subcapsular polar age-related cataract, bilateral: Secondary | ICD-10-CM | POA: Diagnosis not present

## 2022-12-11 DIAGNOSIS — H2513 Age-related nuclear cataract, bilateral: Secondary | ICD-10-CM | POA: Diagnosis not present

## 2022-12-11 DIAGNOSIS — H25013 Cortical age-related cataract, bilateral: Secondary | ICD-10-CM | POA: Diagnosis not present

## 2022-12-11 DIAGNOSIS — H2512 Age-related nuclear cataract, left eye: Secondary | ICD-10-CM | POA: Diagnosis not present

## 2022-12-11 DIAGNOSIS — H18413 Arcus senilis, bilateral: Secondary | ICD-10-CM | POA: Diagnosis not present

## 2022-12-18 DIAGNOSIS — S138XXA Sprain of joints and ligaments of other parts of neck, initial encounter: Secondary | ICD-10-CM | POA: Diagnosis not present

## 2022-12-18 DIAGNOSIS — M9903 Segmental and somatic dysfunction of lumbar region: Secondary | ICD-10-CM | POA: Diagnosis not present

## 2022-12-18 DIAGNOSIS — M9901 Segmental and somatic dysfunction of cervical region: Secondary | ICD-10-CM | POA: Diagnosis not present

## 2022-12-18 DIAGNOSIS — M9902 Segmental and somatic dysfunction of thoracic region: Secondary | ICD-10-CM | POA: Diagnosis not present

## 2022-12-18 DIAGNOSIS — S29012A Strain of muscle and tendon of back wall of thorax, initial encounter: Secondary | ICD-10-CM | POA: Diagnosis not present

## 2022-12-18 DIAGNOSIS — S39012A Strain of muscle, fascia and tendon of lower back, initial encounter: Secondary | ICD-10-CM | POA: Diagnosis not present

## 2023-01-14 ENCOUNTER — Ambulatory Visit
Admission: RE | Admit: 2023-01-14 | Discharge: 2023-01-14 | Disposition: A | Discharge status: Home / Self Care | Payer: Medicare PPO | Source: Ambulatory Visit | Attending: Family Medicine | Admitting: Family Medicine

## 2023-01-14 DIAGNOSIS — R2232 Localized swelling, mass and lump, left upper limb: Secondary | ICD-10-CM

## 2023-01-29 DIAGNOSIS — M9901 Segmental and somatic dysfunction of cervical region: Secondary | ICD-10-CM | POA: Diagnosis not present

## 2023-01-29 DIAGNOSIS — S29012A Strain of muscle and tendon of back wall of thorax, initial encounter: Secondary | ICD-10-CM | POA: Diagnosis not present

## 2023-01-29 DIAGNOSIS — M9903 Segmental and somatic dysfunction of lumbar region: Secondary | ICD-10-CM | POA: Diagnosis not present

## 2023-01-29 DIAGNOSIS — M9902 Segmental and somatic dysfunction of thoracic region: Secondary | ICD-10-CM | POA: Diagnosis not present

## 2023-01-29 DIAGNOSIS — S138XXA Sprain of joints and ligaments of other parts of neck, initial encounter: Secondary | ICD-10-CM | POA: Diagnosis not present

## 2023-01-29 DIAGNOSIS — S39012A Strain of muscle, fascia and tendon of lower back, initial encounter: Secondary | ICD-10-CM | POA: Diagnosis not present

## 2023-02-06 DIAGNOSIS — E039 Hypothyroidism, unspecified: Secondary | ICD-10-CM | POA: Diagnosis not present

## 2023-02-11 DIAGNOSIS — L603 Nail dystrophy: Secondary | ICD-10-CM | POA: Diagnosis not present

## 2023-02-11 DIAGNOSIS — L659 Nonscarring hair loss, unspecified: Secondary | ICD-10-CM | POA: Diagnosis not present

## 2023-02-11 DIAGNOSIS — L300 Nummular dermatitis: Secondary | ICD-10-CM | POA: Diagnosis not present

## 2023-02-11 DIAGNOSIS — Z85828 Personal history of other malignant neoplasm of skin: Secondary | ICD-10-CM | POA: Diagnosis not present

## 2023-02-26 DIAGNOSIS — M9903 Segmental and somatic dysfunction of lumbar region: Secondary | ICD-10-CM | POA: Diagnosis not present

## 2023-02-26 DIAGNOSIS — S138XXA Sprain of joints and ligaments of other parts of neck, initial encounter: Secondary | ICD-10-CM | POA: Diagnosis not present

## 2023-02-26 DIAGNOSIS — S29012A Strain of muscle and tendon of back wall of thorax, initial encounter: Secondary | ICD-10-CM | POA: Diagnosis not present

## 2023-02-26 DIAGNOSIS — M9902 Segmental and somatic dysfunction of thoracic region: Secondary | ICD-10-CM | POA: Diagnosis not present

## 2023-02-26 DIAGNOSIS — S39012A Strain of muscle, fascia and tendon of lower back, initial encounter: Secondary | ICD-10-CM | POA: Diagnosis not present

## 2023-02-26 DIAGNOSIS — M9901 Segmental and somatic dysfunction of cervical region: Secondary | ICD-10-CM | POA: Diagnosis not present

## 2023-03-04 DIAGNOSIS — H2512 Age-related nuclear cataract, left eye: Secondary | ICD-10-CM | POA: Diagnosis not present

## 2023-03-04 DIAGNOSIS — H52209 Unspecified astigmatism, unspecified eye: Secondary | ICD-10-CM | POA: Diagnosis not present

## 2023-03-04 DIAGNOSIS — H25013 Cortical age-related cataract, bilateral: Secondary | ICD-10-CM | POA: Diagnosis not present

## 2023-03-05 DIAGNOSIS — H25013 Cortical age-related cataract, bilateral: Secondary | ICD-10-CM | POA: Diagnosis not present

## 2023-03-05 DIAGNOSIS — H2511 Age-related nuclear cataract, right eye: Secondary | ICD-10-CM | POA: Diagnosis not present

## 2023-03-13 DIAGNOSIS — L309 Dermatitis, unspecified: Secondary | ICD-10-CM | POA: Diagnosis not present

## 2023-03-13 DIAGNOSIS — Z85828 Personal history of other malignant neoplasm of skin: Secondary | ICD-10-CM | POA: Diagnosis not present

## 2023-03-18 DIAGNOSIS — H52209 Unspecified astigmatism, unspecified eye: Secondary | ICD-10-CM | POA: Diagnosis not present

## 2023-03-18 DIAGNOSIS — H2511 Age-related nuclear cataract, right eye: Secondary | ICD-10-CM | POA: Diagnosis not present

## 2023-03-18 DIAGNOSIS — H2513 Age-related nuclear cataract, bilateral: Secondary | ICD-10-CM | POA: Diagnosis not present

## 2023-04-08 DIAGNOSIS — M8588 Other specified disorders of bone density and structure, other site: Secondary | ICD-10-CM | POA: Diagnosis not present

## 2023-04-08 DIAGNOSIS — Z1231 Encounter for screening mammogram for malignant neoplasm of breast: Secondary | ICD-10-CM | POA: Diagnosis not present

## 2023-04-09 DIAGNOSIS — M9901 Segmental and somatic dysfunction of cervical region: Secondary | ICD-10-CM | POA: Diagnosis not present

## 2023-04-09 DIAGNOSIS — S138XXA Sprain of joints and ligaments of other parts of neck, initial encounter: Secondary | ICD-10-CM | POA: Diagnosis not present

## 2023-04-09 DIAGNOSIS — M9902 Segmental and somatic dysfunction of thoracic region: Secondary | ICD-10-CM | POA: Diagnosis not present

## 2023-04-09 DIAGNOSIS — S29012A Strain of muscle and tendon of back wall of thorax, initial encounter: Secondary | ICD-10-CM | POA: Diagnosis not present

## 2023-04-09 DIAGNOSIS — M9903 Segmental and somatic dysfunction of lumbar region: Secondary | ICD-10-CM | POA: Diagnosis not present

## 2023-04-09 DIAGNOSIS — S39012A Strain of muscle, fascia and tendon of lower back, initial encounter: Secondary | ICD-10-CM | POA: Diagnosis not present

## 2023-04-18 DIAGNOSIS — L659 Nonscarring hair loss, unspecified: Secondary | ICD-10-CM | POA: Diagnosis not present

## 2023-04-18 DIAGNOSIS — E039 Hypothyroidism, unspecified: Secondary | ICD-10-CM | POA: Diagnosis not present

## 2023-04-22 DIAGNOSIS — L309 Dermatitis, unspecified: Secondary | ICD-10-CM | POA: Diagnosis not present

## 2023-04-22 DIAGNOSIS — D225 Melanocytic nevi of trunk: Secondary | ICD-10-CM | POA: Diagnosis not present

## 2023-04-22 DIAGNOSIS — Z85828 Personal history of other malignant neoplasm of skin: Secondary | ICD-10-CM | POA: Diagnosis not present

## 2023-04-22 DIAGNOSIS — L282 Other prurigo: Secondary | ICD-10-CM | POA: Diagnosis not present

## 2023-04-22 DIAGNOSIS — L821 Other seborrheic keratosis: Secondary | ICD-10-CM | POA: Diagnosis not present

## 2023-05-07 DIAGNOSIS — S29012A Strain of muscle and tendon of back wall of thorax, initial encounter: Secondary | ICD-10-CM | POA: Diagnosis not present

## 2023-05-07 DIAGNOSIS — M9903 Segmental and somatic dysfunction of lumbar region: Secondary | ICD-10-CM | POA: Diagnosis not present

## 2023-05-07 DIAGNOSIS — M9901 Segmental and somatic dysfunction of cervical region: Secondary | ICD-10-CM | POA: Diagnosis not present

## 2023-05-07 DIAGNOSIS — S39012A Strain of muscle, fascia and tendon of lower back, initial encounter: Secondary | ICD-10-CM | POA: Diagnosis not present

## 2023-05-07 DIAGNOSIS — S138XXA Sprain of joints and ligaments of other parts of neck, initial encounter: Secondary | ICD-10-CM | POA: Diagnosis not present

## 2023-05-07 DIAGNOSIS — M9902 Segmental and somatic dysfunction of thoracic region: Secondary | ICD-10-CM | POA: Diagnosis not present

## 2023-06-11 DIAGNOSIS — M9901 Segmental and somatic dysfunction of cervical region: Secondary | ICD-10-CM | POA: Diagnosis not present

## 2023-06-11 DIAGNOSIS — S39012A Strain of muscle, fascia and tendon of lower back, initial encounter: Secondary | ICD-10-CM | POA: Diagnosis not present

## 2023-06-11 DIAGNOSIS — S29012A Strain of muscle and tendon of back wall of thorax, initial encounter: Secondary | ICD-10-CM | POA: Diagnosis not present

## 2023-06-11 DIAGNOSIS — M9902 Segmental and somatic dysfunction of thoracic region: Secondary | ICD-10-CM | POA: Diagnosis not present

## 2023-06-11 DIAGNOSIS — M9903 Segmental and somatic dysfunction of lumbar region: Secondary | ICD-10-CM | POA: Diagnosis not present

## 2023-06-11 DIAGNOSIS — S138XXA Sprain of joints and ligaments of other parts of neck, initial encounter: Secondary | ICD-10-CM | POA: Diagnosis not present

## 2023-06-12 DIAGNOSIS — R7303 Prediabetes: Secondary | ICD-10-CM | POA: Diagnosis not present

## 2023-07-16 DIAGNOSIS — S29012A Strain of muscle and tendon of back wall of thorax, initial encounter: Secondary | ICD-10-CM | POA: Diagnosis not present

## 2023-07-16 DIAGNOSIS — S138XXA Sprain of joints and ligaments of other parts of neck, initial encounter: Secondary | ICD-10-CM | POA: Diagnosis not present

## 2023-07-16 DIAGNOSIS — M9903 Segmental and somatic dysfunction of lumbar region: Secondary | ICD-10-CM | POA: Diagnosis not present

## 2023-07-16 DIAGNOSIS — M9901 Segmental and somatic dysfunction of cervical region: Secondary | ICD-10-CM | POA: Diagnosis not present

## 2023-07-16 DIAGNOSIS — S39012A Strain of muscle, fascia and tendon of lower back, initial encounter: Secondary | ICD-10-CM | POA: Diagnosis not present

## 2023-07-16 DIAGNOSIS — M9902 Segmental and somatic dysfunction of thoracic region: Secondary | ICD-10-CM | POA: Diagnosis not present

## 2023-08-26 DIAGNOSIS — M9903 Segmental and somatic dysfunction of lumbar region: Secondary | ICD-10-CM | POA: Diagnosis not present

## 2023-08-26 DIAGNOSIS — S29012A Strain of muscle and tendon of back wall of thorax, initial encounter: Secondary | ICD-10-CM | POA: Diagnosis not present

## 2023-08-26 DIAGNOSIS — S39012A Strain of muscle, fascia and tendon of lower back, initial encounter: Secondary | ICD-10-CM | POA: Diagnosis not present

## 2023-08-26 DIAGNOSIS — S138XXA Sprain of joints and ligaments of other parts of neck, initial encounter: Secondary | ICD-10-CM | POA: Diagnosis not present

## 2023-08-26 DIAGNOSIS — M9902 Segmental and somatic dysfunction of thoracic region: Secondary | ICD-10-CM | POA: Diagnosis not present

## 2023-08-26 DIAGNOSIS — M9901 Segmental and somatic dysfunction of cervical region: Secondary | ICD-10-CM | POA: Diagnosis not present

## 2023-09-23 DIAGNOSIS — L245 Irritant contact dermatitis due to other chemical products: Secondary | ICD-10-CM | POA: Diagnosis not present

## 2023-09-23 DIAGNOSIS — Z85828 Personal history of other malignant neoplasm of skin: Secondary | ICD-10-CM | POA: Diagnosis not present

## 2023-09-24 DIAGNOSIS — M9902 Segmental and somatic dysfunction of thoracic region: Secondary | ICD-10-CM | POA: Diagnosis not present

## 2023-09-24 DIAGNOSIS — M9903 Segmental and somatic dysfunction of lumbar region: Secondary | ICD-10-CM | POA: Diagnosis not present

## 2023-09-24 DIAGNOSIS — M9901 Segmental and somatic dysfunction of cervical region: Secondary | ICD-10-CM | POA: Diagnosis not present

## 2023-09-24 DIAGNOSIS — S29012A Strain of muscle and tendon of back wall of thorax, initial encounter: Secondary | ICD-10-CM | POA: Diagnosis not present

## 2023-09-24 DIAGNOSIS — S138XXA Sprain of joints and ligaments of other parts of neck, initial encounter: Secondary | ICD-10-CM | POA: Diagnosis not present

## 2023-09-24 DIAGNOSIS — S39012A Strain of muscle, fascia and tendon of lower back, initial encounter: Secondary | ICD-10-CM | POA: Diagnosis not present

## 2023-10-15 DIAGNOSIS — L270 Generalized skin eruption due to drugs and medicaments taken internally: Secondary | ICD-10-CM | POA: Diagnosis not present

## 2023-10-15 DIAGNOSIS — Z85828 Personal history of other malignant neoplasm of skin: Secondary | ICD-10-CM | POA: Diagnosis not present

## 2023-10-15 DIAGNOSIS — L309 Dermatitis, unspecified: Secondary | ICD-10-CM | POA: Diagnosis not present

## 2023-10-15 DIAGNOSIS — L280 Lichen simplex chronicus: Secondary | ICD-10-CM | POA: Diagnosis not present

## 2023-10-15 DIAGNOSIS — L501 Idiopathic urticaria: Secondary | ICD-10-CM | POA: Diagnosis not present

## 2023-10-15 DIAGNOSIS — Z79899 Other long term (current) drug therapy: Secondary | ICD-10-CM | POA: Diagnosis not present

## 2023-10-23 DIAGNOSIS — L309 Dermatitis, unspecified: Secondary | ICD-10-CM | POA: Diagnosis not present

## 2023-10-23 DIAGNOSIS — L308 Other specified dermatitis: Secondary | ICD-10-CM | POA: Diagnosis not present

## 2023-10-23 DIAGNOSIS — L501 Idiopathic urticaria: Secondary | ICD-10-CM | POA: Diagnosis not present

## 2023-10-23 DIAGNOSIS — Z85828 Personal history of other malignant neoplasm of skin: Secondary | ICD-10-CM | POA: Diagnosis not present

## 2023-10-28 DIAGNOSIS — H0288B Meibomian gland dysfunction left eye, upper and lower eyelids: Secondary | ICD-10-CM | POA: Diagnosis not present

## 2023-10-28 DIAGNOSIS — H0288A Meibomian gland dysfunction right eye, upper and lower eyelids: Secondary | ICD-10-CM | POA: Diagnosis not present

## 2023-10-28 DIAGNOSIS — H16223 Keratoconjunctivitis sicca, not specified as Sjogren's, bilateral: Secondary | ICD-10-CM | POA: Diagnosis not present

## 2023-10-29 DIAGNOSIS — S39012A Strain of muscle, fascia and tendon of lower back, initial encounter: Secondary | ICD-10-CM | POA: Diagnosis not present

## 2023-10-29 DIAGNOSIS — M9901 Segmental and somatic dysfunction of cervical region: Secondary | ICD-10-CM | POA: Diagnosis not present

## 2023-10-29 DIAGNOSIS — S29012A Strain of muscle and tendon of back wall of thorax, initial encounter: Secondary | ICD-10-CM | POA: Diagnosis not present

## 2023-10-29 DIAGNOSIS — S138XXA Sprain of joints and ligaments of other parts of neck, initial encounter: Secondary | ICD-10-CM | POA: Diagnosis not present

## 2023-10-29 DIAGNOSIS — M9902 Segmental and somatic dysfunction of thoracic region: Secondary | ICD-10-CM | POA: Diagnosis not present

## 2023-10-29 DIAGNOSIS — M9903 Segmental and somatic dysfunction of lumbar region: Secondary | ICD-10-CM | POA: Diagnosis not present

## 2023-11-25 DIAGNOSIS — S29012A Strain of muscle and tendon of back wall of thorax, initial encounter: Secondary | ICD-10-CM | POA: Diagnosis not present

## 2023-11-25 DIAGNOSIS — M9901 Segmental and somatic dysfunction of cervical region: Secondary | ICD-10-CM | POA: Diagnosis not present

## 2023-11-25 DIAGNOSIS — M9903 Segmental and somatic dysfunction of lumbar region: Secondary | ICD-10-CM | POA: Diagnosis not present

## 2023-11-25 DIAGNOSIS — M9902 Segmental and somatic dysfunction of thoracic region: Secondary | ICD-10-CM | POA: Diagnosis not present

## 2023-11-25 DIAGNOSIS — S138XXA Sprain of joints and ligaments of other parts of neck, initial encounter: Secondary | ICD-10-CM | POA: Diagnosis not present

## 2023-11-25 DIAGNOSIS — S39012A Strain of muscle, fascia and tendon of lower back, initial encounter: Secondary | ICD-10-CM | POA: Diagnosis not present

## 2023-12-23 DIAGNOSIS — S39012A Strain of muscle, fascia and tendon of lower back, initial encounter: Secondary | ICD-10-CM | POA: Diagnosis not present

## 2023-12-23 DIAGNOSIS — M9901 Segmental and somatic dysfunction of cervical region: Secondary | ICD-10-CM | POA: Diagnosis not present

## 2023-12-23 DIAGNOSIS — S29012A Strain of muscle and tendon of back wall of thorax, initial encounter: Secondary | ICD-10-CM | POA: Diagnosis not present

## 2023-12-23 DIAGNOSIS — M9903 Segmental and somatic dysfunction of lumbar region: Secondary | ICD-10-CM | POA: Diagnosis not present

## 2023-12-23 DIAGNOSIS — S138XXA Sprain of joints and ligaments of other parts of neck, initial encounter: Secondary | ICD-10-CM | POA: Diagnosis not present

## 2023-12-23 DIAGNOSIS — M9902 Segmental and somatic dysfunction of thoracic region: Secondary | ICD-10-CM | POA: Diagnosis not present

## 2023-12-31 DIAGNOSIS — N952 Postmenopausal atrophic vaginitis: Secondary | ICD-10-CM | POA: Diagnosis not present

## 2023-12-31 DIAGNOSIS — K219 Gastro-esophageal reflux disease without esophagitis: Secondary | ICD-10-CM | POA: Diagnosis not present

## 2023-12-31 DIAGNOSIS — R7303 Prediabetes: Secondary | ICD-10-CM | POA: Diagnosis not present

## 2023-12-31 DIAGNOSIS — D509 Iron deficiency anemia, unspecified: Secondary | ICD-10-CM | POA: Diagnosis not present

## 2023-12-31 DIAGNOSIS — F322 Major depressive disorder, single episode, severe without psychotic features: Secondary | ICD-10-CM | POA: Diagnosis not present

## 2023-12-31 DIAGNOSIS — M8580 Other specified disorders of bone density and structure, unspecified site: Secondary | ICD-10-CM | POA: Diagnosis not present

## 2023-12-31 DIAGNOSIS — Z0000 Encounter for general adult medical examination without abnormal findings: Secondary | ICD-10-CM | POA: Diagnosis not present

## 2023-12-31 DIAGNOSIS — E039 Hypothyroidism, unspecified: Secondary | ICD-10-CM | POA: Diagnosis not present

## 2023-12-31 DIAGNOSIS — E7800 Pure hypercholesterolemia, unspecified: Secondary | ICD-10-CM | POA: Diagnosis not present
